# Patient Record
Sex: Female | Born: 1950
Health system: Southern US, Community
[De-identification: ages and names within clinical notes are randomized; demographics above are authoritative.]

## PROBLEM LIST (undated history)

## (undated) DIAGNOSIS — E782 Mixed hyperlipidemia: Secondary | ICD-10-CM

## (undated) DIAGNOSIS — F0390 Unspecified dementia without behavioral disturbance: Secondary | ICD-10-CM

## (undated) DIAGNOSIS — M810 Age-related osteoporosis without current pathological fracture: Secondary | ICD-10-CM

## (undated) DIAGNOSIS — G43909 Migraine, unspecified, not intractable, without status migrainosus: Secondary | ICD-10-CM

## (undated) DIAGNOSIS — R519 Headache, unspecified: Secondary | ICD-10-CM

## (undated) DIAGNOSIS — E2839 Other primary ovarian failure: Secondary | ICD-10-CM

## (undated) DIAGNOSIS — I10 Essential (primary) hypertension: Secondary | ICD-10-CM

## (undated) DIAGNOSIS — I83893 Varicose veins of bilateral lower extremities with other complications: Secondary | ICD-10-CM

## (undated) DIAGNOSIS — R011 Cardiac murmur, unspecified: Secondary | ICD-10-CM

## (undated) DIAGNOSIS — R1310 Dysphagia, unspecified: Secondary | ICD-10-CM

## (undated) DIAGNOSIS — Z860100 Personal history of colon polyps, unspecified: Secondary | ICD-10-CM

## (undated) DIAGNOSIS — R51 Headache: Secondary | ICD-10-CM

## (undated) HISTORY — DX: Essential (primary) hypertension: I10

## (undated) HISTORY — DX: Personal history of colon polyps, unspecified: Z86.0100

## (undated) HISTORY — DX: Migraine, unspecified, not intractable, without status migrainosus: G43.909

## (undated) HISTORY — DX: Age-related osteoporosis without current pathological fracture: M81.0

## (undated) HISTORY — DX: Headache: R51

## (undated) HISTORY — DX: Personal history of colonic polyps: Z86.010

## (undated) HISTORY — DX: Varicose veins of bilateral lower extremities with other complications: I83.893

## (undated) HISTORY — DX: Mixed hyperlipidemia: E78.2

## (undated) HISTORY — PX: WISDOM TOOTH EXTRACTION: SHX21

## (undated) HISTORY — DX: Other primary ovarian failure: E28.39

## (undated) HISTORY — DX: Headache, unspecified: R51.9

## (undated) HISTORY — PX: BREAST BIOPSY: SHX20

---

## 1998-02-14 ENCOUNTER — Other Ambulatory Visit: Admission: RE | Admit: 1998-02-14 | Discharge: 1998-02-14 | Payer: Self-pay | Admitting: Obstetrics and Gynecology

## 1999-03-15 ENCOUNTER — Other Ambulatory Visit: Admission: RE | Admit: 1999-03-15 | Discharge: 1999-03-15 | Payer: Self-pay | Admitting: Obstetrics and Gynecology

## 2000-03-17 ENCOUNTER — Other Ambulatory Visit: Admission: RE | Admit: 2000-03-17 | Discharge: 2000-03-17 | Payer: Self-pay | Admitting: Obstetrics and Gynecology

## 2000-09-22 ENCOUNTER — Encounter: Payer: Self-pay | Admitting: Obstetrics and Gynecology

## 2000-09-22 ENCOUNTER — Encounter: Admission: RE | Admit: 2000-09-22 | Discharge: 2000-09-22 | Payer: Self-pay | Admitting: Obstetrics and Gynecology

## 2000-11-27 ENCOUNTER — Encounter: Admission: RE | Admit: 2000-11-27 | Discharge: 2000-11-27 | Payer: Self-pay | Admitting: Obstetrics and Gynecology

## 2000-11-27 ENCOUNTER — Other Ambulatory Visit: Admission: RE | Admit: 2000-11-27 | Discharge: 2000-11-27 | Payer: Self-pay | Admitting: Obstetrics and Gynecology

## 2000-11-27 ENCOUNTER — Encounter: Payer: Self-pay | Admitting: Obstetrics and Gynecology

## 2001-04-06 ENCOUNTER — Other Ambulatory Visit: Admission: RE | Admit: 2001-04-06 | Discharge: 2001-04-06 | Payer: Self-pay | Admitting: Obstetrics and Gynecology

## 2001-11-29 ENCOUNTER — Encounter: Admission: RE | Admit: 2001-11-29 | Discharge: 2001-11-29 | Payer: Self-pay | Admitting: Obstetrics and Gynecology

## 2001-11-29 ENCOUNTER — Encounter: Payer: Self-pay | Admitting: Obstetrics and Gynecology

## 2001-12-06 ENCOUNTER — Encounter (INDEPENDENT_AMBULATORY_CARE_PROVIDER_SITE_OTHER): Payer: Self-pay | Admitting: *Deleted

## 2001-12-06 ENCOUNTER — Ambulatory Visit (HOSPITAL_BASED_OUTPATIENT_CLINIC_OR_DEPARTMENT_OTHER): Admission: RE | Admit: 2001-12-06 | Discharge: 2001-12-06 | Payer: Self-pay | Admitting: General Surgery

## 2002-04-15 ENCOUNTER — Other Ambulatory Visit: Admission: RE | Admit: 2002-04-15 | Discharge: 2002-04-15 | Payer: Self-pay | Admitting: Obstetrics and Gynecology

## 2003-05-17 ENCOUNTER — Other Ambulatory Visit: Admission: RE | Admit: 2003-05-17 | Discharge: 2003-05-17 | Payer: Self-pay | Admitting: Obstetrics and Gynecology

## 2003-07-27 ENCOUNTER — Encounter: Admission: RE | Admit: 2003-07-27 | Discharge: 2003-07-27 | Payer: Self-pay | Admitting: Family Medicine

## 2003-07-27 ENCOUNTER — Encounter: Payer: Self-pay | Admitting: Family Medicine

## 2003-09-20 ENCOUNTER — Ambulatory Visit (HOSPITAL_COMMUNITY): Admission: RE | Admit: 2003-09-20 | Discharge: 2003-09-20 | Payer: Self-pay | Admitting: *Deleted

## 2003-09-20 ENCOUNTER — Encounter (INDEPENDENT_AMBULATORY_CARE_PROVIDER_SITE_OTHER): Payer: Self-pay | Admitting: Specialist

## 2004-05-29 ENCOUNTER — Other Ambulatory Visit: Admission: RE | Admit: 2004-05-29 | Discharge: 2004-05-29 | Payer: Self-pay | Admitting: Obstetrics and Gynecology

## 2005-06-17 ENCOUNTER — Other Ambulatory Visit: Admission: RE | Admit: 2005-06-17 | Discharge: 2005-06-17 | Payer: Self-pay | Admitting: Obstetrics and Gynecology

## 2006-10-24 ENCOUNTER — Emergency Department (HOSPITAL_COMMUNITY): Admission: EM | Admit: 2006-10-24 | Discharge: 2006-10-25 | Payer: Self-pay | Admitting: Emergency Medicine

## 2008-10-02 ENCOUNTER — Encounter: Admission: RE | Admit: 2008-10-02 | Discharge: 2008-10-02 | Payer: Self-pay | Admitting: Obstetrics and Gynecology

## 2011-03-25 ENCOUNTER — Emergency Department (HOSPITAL_COMMUNITY)
Admission: EM | Admit: 2011-03-25 | Discharge: 2011-03-25 | Disposition: A | Discharge status: Home / Self Care | Payer: BC Managed Care – PPO | Attending: Emergency Medicine | Admitting: Emergency Medicine

## 2011-03-25 ENCOUNTER — Emergency Department (HOSPITAL_COMMUNITY): Payer: BC Managed Care – PPO

## 2011-03-25 DIAGNOSIS — I059 Rheumatic mitral valve disease, unspecified: Secondary | ICD-10-CM | POA: Insufficient documentation

## 2011-03-25 DIAGNOSIS — Z860100 Personal history of colon polyps, unspecified: Secondary | ICD-10-CM | POA: Insufficient documentation

## 2011-03-25 DIAGNOSIS — Z79899 Other long term (current) drug therapy: Secondary | ICD-10-CM | POA: Insufficient documentation

## 2011-03-25 DIAGNOSIS — N201 Calculus of ureter: Secondary | ICD-10-CM | POA: Insufficient documentation

## 2011-03-25 DIAGNOSIS — G43909 Migraine, unspecified, not intractable, without status migrainosus: Secondary | ICD-10-CM | POA: Insufficient documentation

## 2011-03-25 LAB — DIFFERENTIAL
Basophils Absolute: 0 10*3/uL (ref 0.0–0.1)
Basophils Relative: 0 % (ref 0–1)
Eosinophils Absolute: 0 10*3/uL (ref 0.0–0.7)
Neutrophils Relative %: 84 % — ABNORMAL HIGH (ref 43–77)

## 2011-03-25 LAB — URINALYSIS, ROUTINE W REFLEX MICROSCOPIC
Ketones, ur: 15 mg/dL — AB
Nitrite: NEGATIVE
Specific Gravity, Urine: 1.015 (ref 1.005–1.030)
pH: 6.5 (ref 5.0–8.0)

## 2011-03-25 LAB — CBC
Platelets: 203 10*3/uL (ref 150–400)
RBC: 4.52 MIL/uL (ref 3.87–5.11)
WBC: 13.1 10*3/uL — ABNORMAL HIGH (ref 4.0–10.5)

## 2011-03-25 LAB — COMPREHENSIVE METABOLIC PANEL
Albumin: 4.2 g/dL (ref 3.5–5.2)
BUN: 11 mg/dL (ref 6–23)
Chloride: 109 mEq/L (ref 96–112)
Creatinine, Ser: 0.81 mg/dL (ref 0.4–1.2)
Total Bilirubin: 0.3 mg/dL (ref 0.3–1.2)

## 2011-03-25 LAB — URINE MICROSCOPIC-ADD ON

## 2011-03-28 NOTE — Op Note (Signed)
[place]  Patient:    Deanna [name], Deanna [name] Visit Number: [id number] MRN: [id number]          Service Type: DSU Location: [facility] Attending Physician:  Janalyn Rouse Dictated by:   Rose Phi. Maple Hudson, M.D. Proc. Date: 12/06/01 Admit Date:  12/06/2001                             Operative Report  PREOPERATIVE DIAGNOSIS:  Fibroadenoma of the left breast.  POSTOPERATIVE DIAGNOSIS:  Fibroadenoma of the left breast.  OPERATION PERFORMED:  Excision of same.  SURGEON:  Rose Phi. Maple Hudson, M.D.  ANESTHESIA:  MAC.  DESCRIPTION OF PROCEDURE:  The patient was placed on the operating table with the left arm extending on the arm board.  The left breast and axilla were prepped and draped in the usual fashion.  The lesion that was easily palpable was high in the upper outer quadrant and a curved incision overlying it was outlined and the area infiltrated with 1% Xylocaine.  Incision was made and I exposed the fibroadenoma and put a 2-0 suture into it for traction purposes. We then simply excised the fibroadenoma.  Hemostasis obtained with the cautery.  Subcuticular closure with 4-0 Monocryl and Steri-Strips carried out. Dressing applied.  The patient was transferred to the recovery room in satisfactory condition having tolerated the procedure well. Dictated by:   Rose Phi. Maple Hudson, M.D. Attending Physician:  Janalyn Rouse DD:  12/06/01 TD:  12/06/01 Job: [id number]

## 2012-05-18 IMAGING — CT CT ABD-PELV W/O CM
1 of 2 series · 15 of 32 positions shown, 19 images · non-contrast
Comparison: Contrast enhanced abdominal pelvic CT 10/24/2006.

CLINICAL DATA: Right flank pain with nausea and hematuria.
Question ureteral calculus.

CT ABDOMEN AND PELVIS WITHOUT CONTRAST
TECHNIQUE: Multidetector CT imaging of the abdomen and pelvis was
performed following the standard protocol without intravenous
contrast.

[Series 2: under 200# stone no prev · axial · 0.57mm/px · z∈[-417,-87]mm · 15 of 72 slices shown, 19 images]
[im 3/72  soft-tissue]
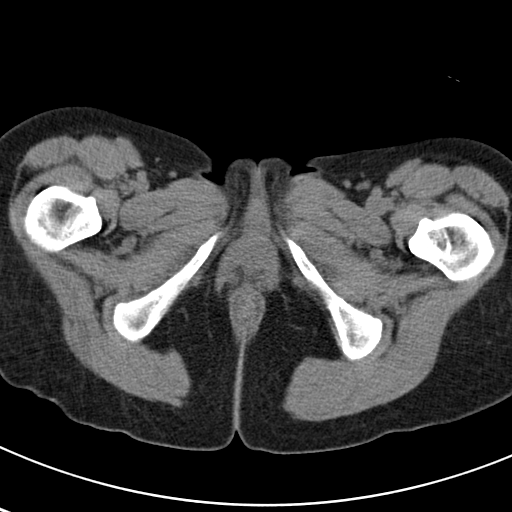
[im 3/72  bone]
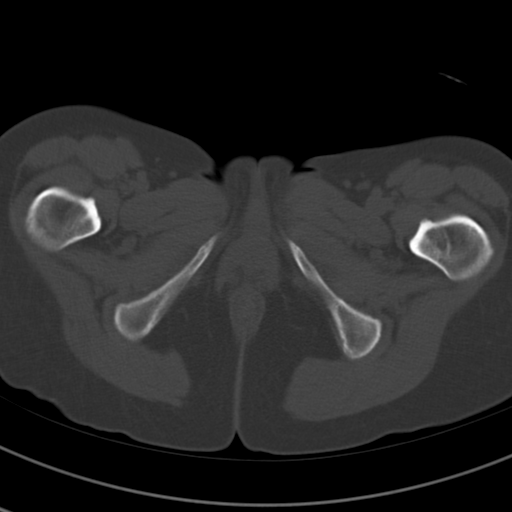
[im 9/72  soft-tissue]
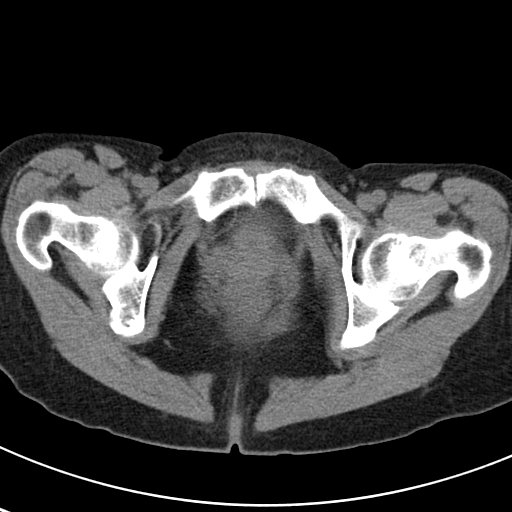
[im 15/72  soft-tissue]
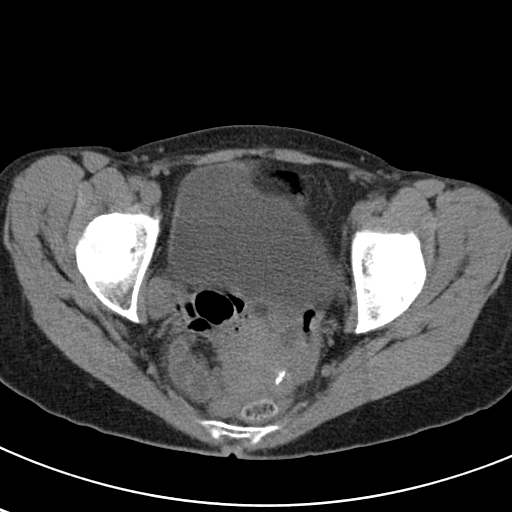
[im 20/72  soft-tissue]
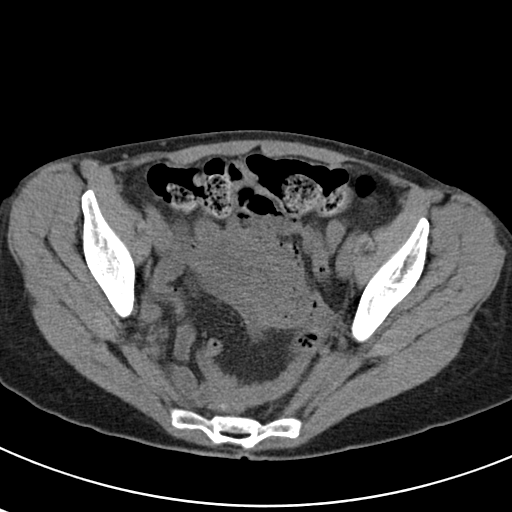
[im 26/72  soft-tissue]
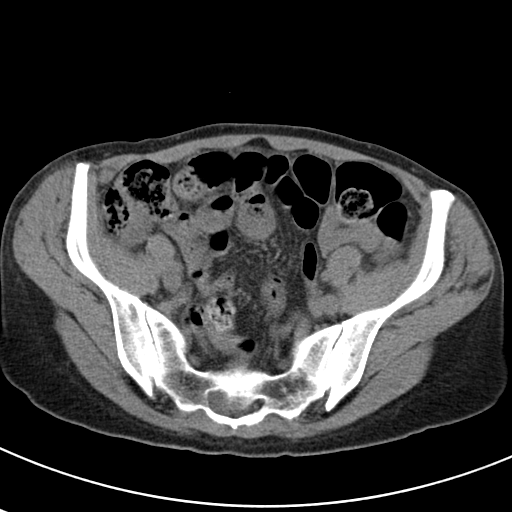
[im 32/72  soft-tissue]
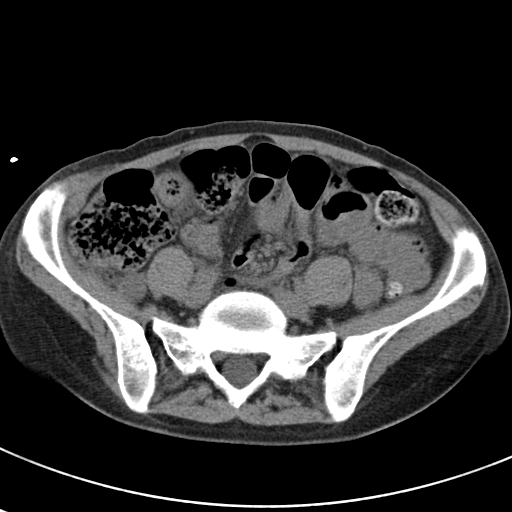
[im 37/72  soft-tissue]
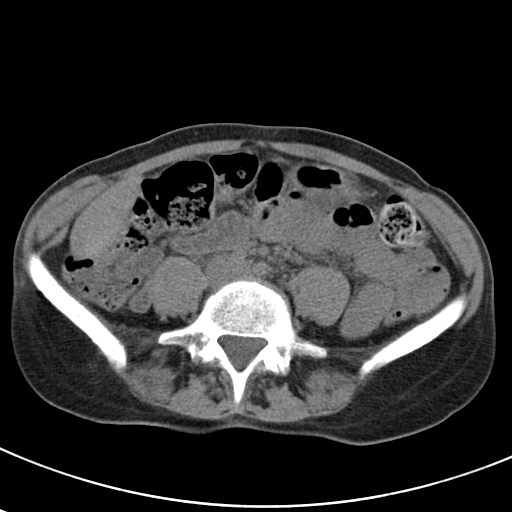
[im 40/72  soft-tissue]
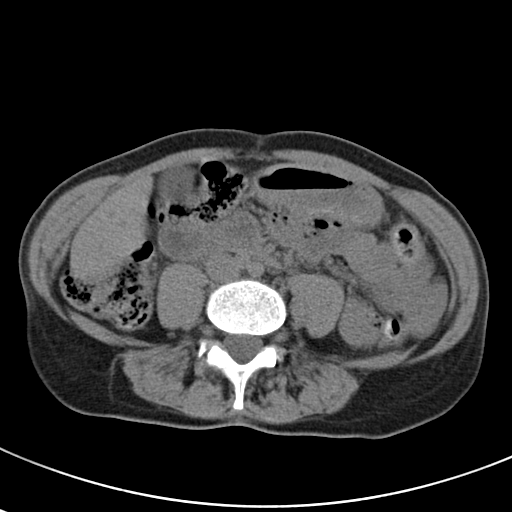
[im 46/72  soft-tissue]
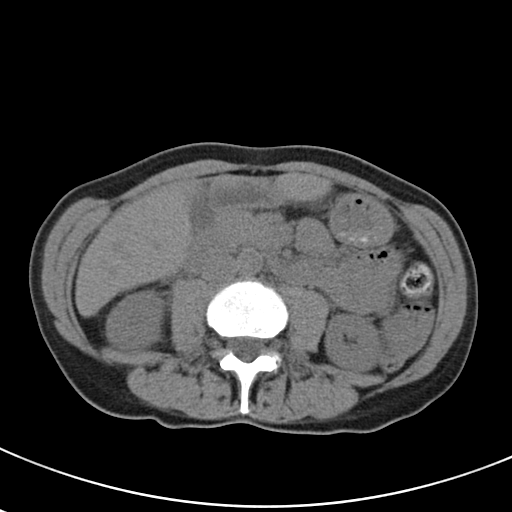
[im 46/72  bone]
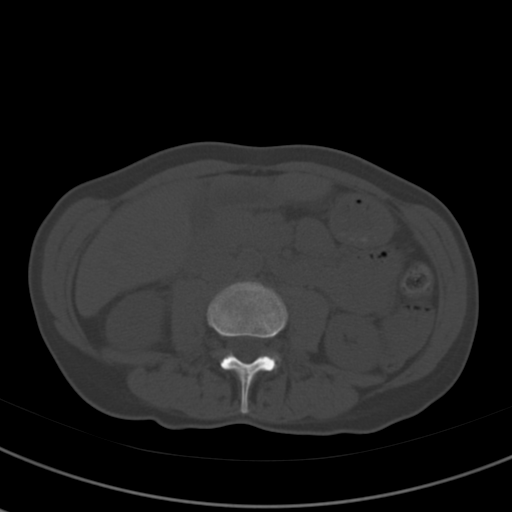
[im 52/72  soft-tissue]
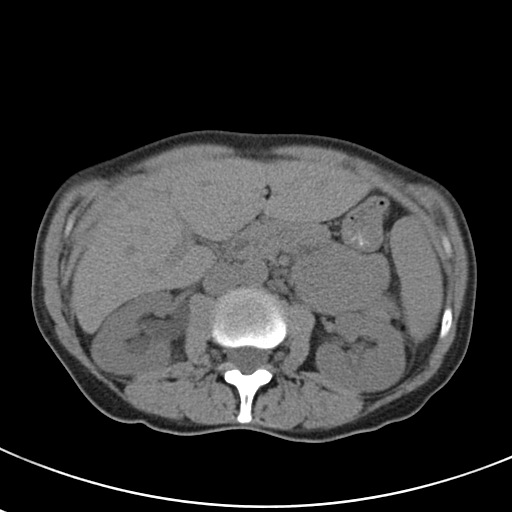
[im 57/72  soft-tissue]
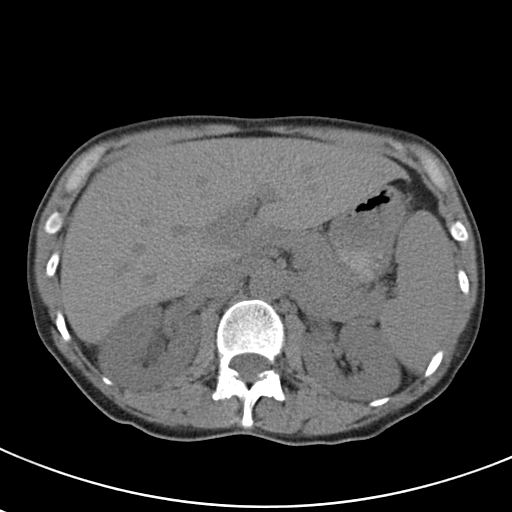
[im 60/72  lung]
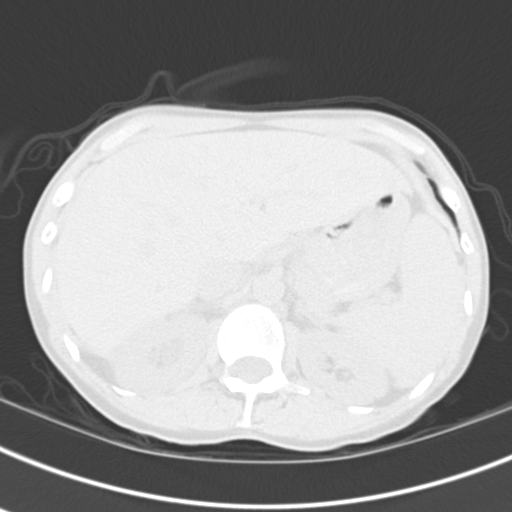
[im 63/72  soft-tissue]
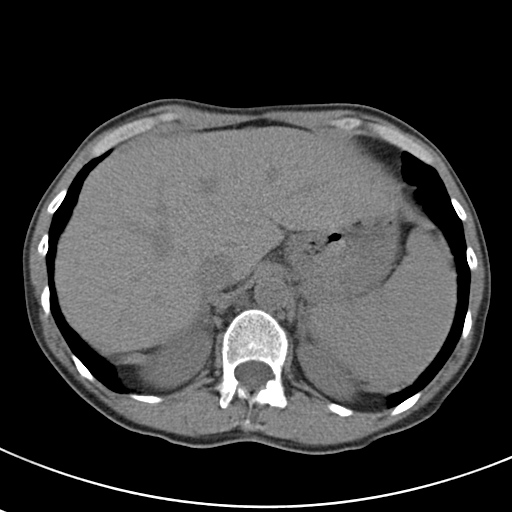
[im 63/72  lung]
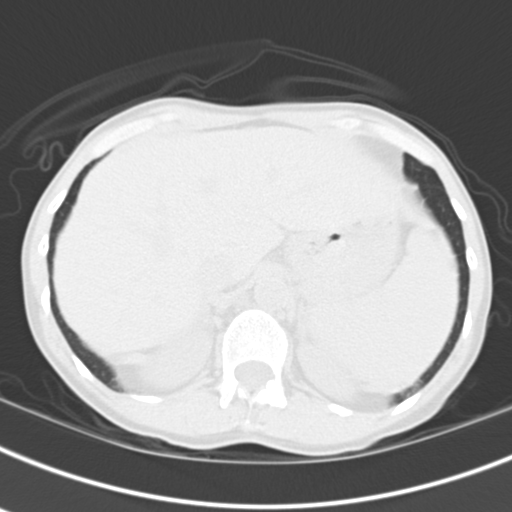
[im 66/72  lung]
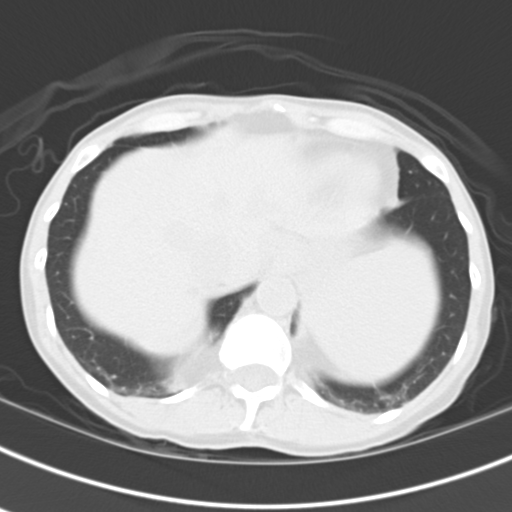
[im 69/72  soft-tissue]
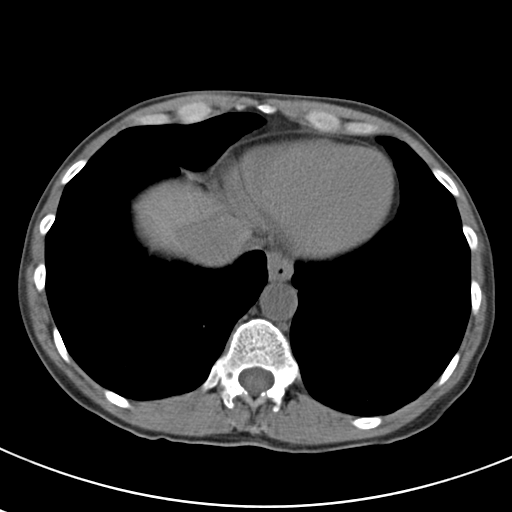
[im 69/72  lung]
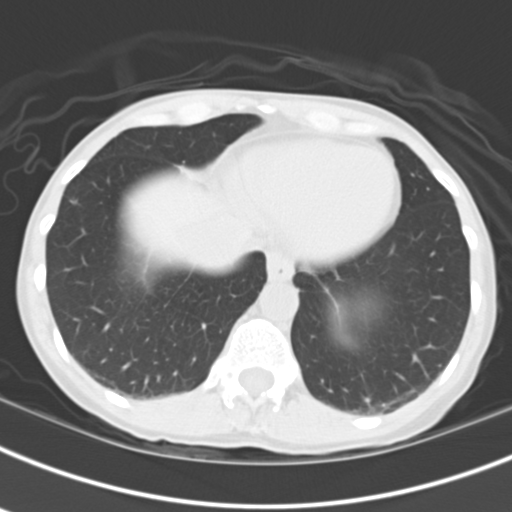

[15 of 32 positions shown; findings below may reference images not displayed]

FINDINGS: There is moderate right-sided hydronephrosis and
hydroureter with asymmetric perinephric soft tissue stranding.  The
right ureter is dilated into the pelvis where there is an
obstructing calculus measuring 5 mm on image 57.  This is located
approximately 2 cm proximal to the ureteral vesicle junction and is
visible on the scout image.  There is a small nonobstructing
calculus posteriorly in the mid-left kidney on image 18.  No other
urinary tract calculi are demonstrated.

The lung bases are clear.  There is no pleural effusion.  As
evaluated in the noncontrast state, the liver, spleen, gallbladder,
pancreas and adrenal glands appear unremarkable.

There are calcified uterine fibroids on the left.  No adnexal mass
or pelvic inflammatory process is demonstrated.  There is no
evidence of bladder calculus.  Bowel gas pattern is normal.  There
are no acute osseous findings.
IMPRESSION: 1.  Obstructing 5 mm calculus in the distal right ureter as
described.  This is visible on the scout image.
2.  Nonobstructing small left renal calculus.
3.  No other acute abdominal findings.

## 2012-12-30 ENCOUNTER — Other Ambulatory Visit: Payer: Self-pay | Admitting: Dermatology

## 2013-06-27 ENCOUNTER — Other Ambulatory Visit: Payer: Self-pay | Admitting: Gastroenterology

## 2013-11-04 ENCOUNTER — Ambulatory Visit: Payer: BC Managed Care – PPO | Admitting: Family Medicine

## 2013-12-30 ENCOUNTER — Other Ambulatory Visit: Payer: Self-pay | Admitting: Dermatology

## 2016-10-17 ENCOUNTER — Other Ambulatory Visit (HOSPITAL_COMMUNITY): Payer: Self-pay | Admitting: Foot & Ankle Surgery

## 2016-10-17 DIAGNOSIS — I739 Peripheral vascular disease, unspecified: Secondary | ICD-10-CM

## 2016-10-23 ENCOUNTER — Ambulatory Visit (HOSPITAL_COMMUNITY)
Admission: RE | Admit: 2016-10-23 | Discharge: 2016-10-23 | Disposition: A | Discharge status: Home / Self Care | Payer: BLUE CROSS/BLUE SHIELD | Source: Ambulatory Visit | Attending: Cardiology | Admitting: Cardiology

## 2016-10-23 DIAGNOSIS — I739 Peripheral vascular disease, unspecified: Secondary | ICD-10-CM | POA: Insufficient documentation

## 2016-10-23 DIAGNOSIS — M79629 Pain in unspecified upper arm: Secondary | ICD-10-CM | POA: Diagnosis not present

## 2017-06-17 DIAGNOSIS — G43719 Chronic migraine without aura, intractable, without status migrainosus: Secondary | ICD-10-CM | POA: Diagnosis not present

## 2017-06-17 DIAGNOSIS — G43019 Migraine without aura, intractable, without status migrainosus: Secondary | ICD-10-CM | POA: Diagnosis not present

## 2017-07-01 DIAGNOSIS — G43019 Migraine without aura, intractable, without status migrainosus: Secondary | ICD-10-CM | POA: Diagnosis not present

## 2017-07-01 DIAGNOSIS — G518 Other disorders of facial nerve: Secondary | ICD-10-CM | POA: Diagnosis not present

## 2017-07-01 DIAGNOSIS — G43719 Chronic migraine without aura, intractable, without status migrainosus: Secondary | ICD-10-CM | POA: Diagnosis not present

## 2017-07-01 DIAGNOSIS — R51 Headache: Secondary | ICD-10-CM | POA: Diagnosis not present

## 2017-07-01 DIAGNOSIS — M791 Myalgia: Secondary | ICD-10-CM | POA: Diagnosis not present

## 2017-07-01 DIAGNOSIS — M542 Cervicalgia: Secondary | ICD-10-CM | POA: Diagnosis not present

## 2017-07-07 DIAGNOSIS — H5203 Hypermetropia, bilateral: Secondary | ICD-10-CM | POA: Diagnosis not present

## 2017-07-15 DIAGNOSIS — G43719 Chronic migraine without aura, intractable, without status migrainosus: Secondary | ICD-10-CM | POA: Diagnosis not present

## 2017-07-15 DIAGNOSIS — G43019 Migraine without aura, intractable, without status migrainosus: Secondary | ICD-10-CM | POA: Diagnosis not present

## 2017-07-15 DIAGNOSIS — M542 Cervicalgia: Secondary | ICD-10-CM | POA: Diagnosis not present

## 2017-07-15 DIAGNOSIS — M791 Myalgia: Secondary | ICD-10-CM | POA: Diagnosis not present

## 2017-07-15 DIAGNOSIS — G518 Other disorders of facial nerve: Secondary | ICD-10-CM | POA: Diagnosis not present

## 2017-07-15 DIAGNOSIS — R51 Headache: Secondary | ICD-10-CM | POA: Diagnosis not present

## 2017-07-30 DIAGNOSIS — M791 Myalgia: Secondary | ICD-10-CM | POA: Diagnosis not present

## 2017-07-30 DIAGNOSIS — M542 Cervicalgia: Secondary | ICD-10-CM | POA: Diagnosis not present

## 2017-07-30 DIAGNOSIS — G518 Other disorders of facial nerve: Secondary | ICD-10-CM | POA: Diagnosis not present

## 2017-07-30 DIAGNOSIS — G43019 Migraine without aura, intractable, without status migrainosus: Secondary | ICD-10-CM | POA: Diagnosis not present

## 2017-07-30 DIAGNOSIS — R51 Headache: Secondary | ICD-10-CM | POA: Diagnosis not present

## 2017-07-30 DIAGNOSIS — G43719 Chronic migraine without aura, intractable, without status migrainosus: Secondary | ICD-10-CM | POA: Diagnosis not present

## 2017-08-13 DIAGNOSIS — R51 Headache: Secondary | ICD-10-CM | POA: Diagnosis not present

## 2017-08-13 DIAGNOSIS — G43019 Migraine without aura, intractable, without status migrainosus: Secondary | ICD-10-CM | POA: Diagnosis not present

## 2017-08-13 DIAGNOSIS — G43719 Chronic migraine without aura, intractable, without status migrainosus: Secondary | ICD-10-CM | POA: Diagnosis not present

## 2017-08-13 DIAGNOSIS — G518 Other disorders of facial nerve: Secondary | ICD-10-CM | POA: Diagnosis not present

## 2017-08-13 DIAGNOSIS — M542 Cervicalgia: Secondary | ICD-10-CM | POA: Diagnosis not present

## 2017-08-13 DIAGNOSIS — M791 Myalgia, unspecified site: Secondary | ICD-10-CM | POA: Diagnosis not present

## 2017-08-21 DIAGNOSIS — D2262 Melanocytic nevi of left upper limb, including shoulder: Secondary | ICD-10-CM | POA: Diagnosis not present

## 2017-08-21 DIAGNOSIS — L821 Other seborrheic keratosis: Secondary | ICD-10-CM | POA: Diagnosis not present

## 2017-08-21 DIAGNOSIS — D0361 Melanoma in situ of right upper limb, including shoulder: Secondary | ICD-10-CM | POA: Diagnosis not present

## 2017-08-21 DIAGNOSIS — D225 Melanocytic nevi of trunk: Secondary | ICD-10-CM | POA: Diagnosis not present

## 2017-08-21 DIAGNOSIS — L814 Other melanin hyperpigmentation: Secondary | ICD-10-CM | POA: Diagnosis not present

## 2017-08-21 DIAGNOSIS — Z85828 Personal history of other malignant neoplasm of skin: Secondary | ICD-10-CM | POA: Diagnosis not present

## 2017-08-21 DIAGNOSIS — Z85820 Personal history of malignant melanoma of skin: Secondary | ICD-10-CM | POA: Diagnosis not present

## 2017-08-21 DIAGNOSIS — D2272 Melanocytic nevi of left lower limb, including hip: Secondary | ICD-10-CM | POA: Diagnosis not present

## 2017-08-21 DIAGNOSIS — D485 Neoplasm of uncertain behavior of skin: Secondary | ICD-10-CM | POA: Diagnosis not present

## 2017-08-21 DIAGNOSIS — D2261 Melanocytic nevi of right upper limb, including shoulder: Secondary | ICD-10-CM | POA: Diagnosis not present

## 2017-08-21 DIAGNOSIS — D0360 Melanoma in situ of unspecified upper limb, including shoulder: Secondary | ICD-10-CM | POA: Diagnosis not present

## 2017-08-21 DIAGNOSIS — D2271 Melanocytic nevi of right lower limb, including hip: Secondary | ICD-10-CM | POA: Diagnosis not present

## 2017-08-31 DIAGNOSIS — D0361 Melanoma in situ of right upper limb, including shoulder: Secondary | ICD-10-CM | POA: Diagnosis not present

## 2017-08-31 DIAGNOSIS — Z85828 Personal history of other malignant neoplasm of skin: Secondary | ICD-10-CM | POA: Diagnosis not present

## 2017-11-13 DIAGNOSIS — G43019 Migraine without aura, intractable, without status migrainosus: Secondary | ICD-10-CM | POA: Diagnosis not present

## 2017-11-13 DIAGNOSIS — G43719 Chronic migraine without aura, intractable, without status migrainosus: Secondary | ICD-10-CM | POA: Diagnosis not present

## 2018-02-11 ENCOUNTER — Encounter: Payer: Self-pay | Admitting: Neurology

## 2018-03-05 ENCOUNTER — Encounter: Payer: Self-pay | Admitting: Neurology

## 2018-03-19 DIAGNOSIS — D225 Melanocytic nevi of trunk: Secondary | ICD-10-CM | POA: Diagnosis not present

## 2018-03-19 DIAGNOSIS — Z85828 Personal history of other malignant neoplasm of skin: Secondary | ICD-10-CM | POA: Diagnosis not present

## 2018-03-19 DIAGNOSIS — L989 Disorder of the skin and subcutaneous tissue, unspecified: Secondary | ICD-10-CM | POA: Diagnosis not present

## 2018-03-19 DIAGNOSIS — L814 Other melanin hyperpigmentation: Secondary | ICD-10-CM | POA: Diagnosis not present

## 2018-03-19 DIAGNOSIS — Z85820 Personal history of malignant melanoma of skin: Secondary | ICD-10-CM | POA: Diagnosis not present

## 2018-03-19 DIAGNOSIS — D2261 Melanocytic nevi of right upper limb, including shoulder: Secondary | ICD-10-CM | POA: Diagnosis not present

## 2018-03-19 DIAGNOSIS — D2262 Melanocytic nevi of left upper limb, including shoulder: Secondary | ICD-10-CM | POA: Diagnosis not present

## 2018-03-19 DIAGNOSIS — D485 Neoplasm of uncertain behavior of skin: Secondary | ICD-10-CM | POA: Diagnosis not present

## 2018-03-19 DIAGNOSIS — D2271 Melanocytic nevi of right lower limb, including hip: Secondary | ICD-10-CM | POA: Diagnosis not present

## 2018-03-19 DIAGNOSIS — D2272 Melanocytic nevi of left lower limb, including hip: Secondary | ICD-10-CM | POA: Diagnosis not present

## 2018-04-26 DIAGNOSIS — Z85828 Personal history of other malignant neoplasm of skin: Secondary | ICD-10-CM | POA: Diagnosis not present

## 2018-04-26 DIAGNOSIS — D485 Neoplasm of uncertain behavior of skin: Secondary | ICD-10-CM | POA: Diagnosis not present

## 2018-04-26 DIAGNOSIS — L988 Other specified disorders of the skin and subcutaneous tissue: Secondary | ICD-10-CM | POA: Diagnosis not present

## 2018-05-28 ENCOUNTER — Ambulatory Visit: Payer: PPO | Admitting: Neurology

## 2018-05-28 ENCOUNTER — Other Ambulatory Visit: Payer: Self-pay

## 2018-05-28 ENCOUNTER — Encounter: Payer: Self-pay | Admitting: Neurology

## 2018-05-28 VITALS — BP 140/82 | HR 74 | Ht 63.0 in | Wt 84.8 lb

## 2018-05-28 DIAGNOSIS — F17200 Nicotine dependence, unspecified, uncomplicated: Secondary | ICD-10-CM

## 2018-05-28 DIAGNOSIS — G43009 Migraine without aura, not intractable, without status migrainosus: Secondary | ICD-10-CM | POA: Diagnosis not present

## 2018-05-28 NOTE — Patient Instructions (Signed)
Migraine Recommendations: 1.  Continue topiramate 250mg  at bedtime and nortriptyline 50mg  at bedtime 2.  Take Excedrin at earliest onset of migraine.  You may take Zomig if needed, but try to avoid.  Eventually I would like for you to stop the Zomig 3.  Limit use of pain relievers to no more than 2 days out of the week.  These medications include acetaminophen, ibuprofen, triptans and narcotics.  This will help reduce risk of rebound headaches. 4.  Be aware of common food triggers such as processed sweets, processed foods with nitrites (such as deli meat, hot dogs, sausages), foods with MSG, alcohol (such as wine), chocolate, certain cheeses, certain fruits (dried fruits, bananas, some citrus fruit), vinegar, diet soda. 4.  Avoid caffeine 5.  Routine exercise 6.  Proper sleep hygiene 7.  Stay adequately hydrated with water 8.  Keep a headache diary. 9.  Maintain proper stress management. 10.  Do not skip meals. 11.  Consider supplements:  Magnesium citrate 400mg  to 600mg  daily, riboflavin 400mg , Coenzyme Q 10 100mg  three times daily 12.  Follow up in 6 months

## 2018-05-28 NOTE — Progress Notes (Addendum)
NEUROLOGY CONSULTATION NOTE  THURSA EMME MRN: [id number] DOB: 1951/01/10  Referring provider: Orie Rout, MD Primary care provider: Hulan Fess, MD  Reason for consult:  migraines  HISTORY OF PRESENT ILLNESS: Deanna [name] is a 67 year old right-handed female with mitral valve prolapse and light tobacco smoker who presents for migraines.  Her previous neurologist was Dr. Orie Rout.  However, he is no longer contracted with her new insurance.  History supplemented by prior neurologist's notes.  Onset:  67 years old Location:  right temporal/periorbital Quality:  Non-throbbing Intensity:  Mild to moderate.  She denies new headache, thunderclap headache or severe headache that wakes her from sleep. Aura:  no Prodrome:  no Postdrome:  no Associated symptoms:  photophobia, phonophobia, dizziness, infrequently nausea.  She denies associated vomiting, visual disturbance, autonomic symptoms, osmophobia or unilateral numbness or weakness. Duration:  1 to 2 hours if takes medication at earliest onset Frequency:  8 days a month (4 mild, 3 moderate, 1 severe) Frequency of abortive medication: as needed Triggers:  allergies, skipping meals, sunlight/glare, change in weather, stress, certain scents Aggravating factors:  no Relieving factors:  Excedrin or Zomig Activity:  Severe aggravates  Current NSAIDS:  no Current analgesics:  Excedrin (for mild to severe) Current triptans:  Zomig 5mg  NS (for severe) Current ergotamines:  no Current anti-emetic:  no Current muscle relaxants:  no Current anti-anxiolytic:  no Current sleep aide:  no Current Antihypertensive medications:  no Current Antidepressant medications:  nortriptyline 50mg  Current Anticonvulsant medications:  topiramate 250mg  Current anti-CGRP:  no Current Vitamins/Herbal/Supplements:  MVI Current Antihistamines/Decongestants:  no Other therapy:  no  Past NSAIDS:  Aleve, ibuprofen, Cambia Past  analgesics:  acetaminophen, BC powder, Goody powder, Excedrin Past abortive triptans:  sumatriptan tablet, Maxalt, Relpax Past ergotamines:  No Past muscle relaxants:  Flexeril, Robaxin, Baclofen, Skelaxin Past anti-emetic:  no Past antihypertensive medications:  no Past antidepressant medications:  imipramine Past anticonvulsant medications:  No Past anti-CGRP: no Past vitamins/Herbal/Supplements:  no Past antihistamines/decongestants:  Allegra, Benadryl, Clarinex, Claritin, Dramamine, Flonase, pseudoephedrine Other past therapies:  trigger point injections  Caffeine:  decaff Alcohol:  no Smoker:  5 to 6 cigarettes a day Diet:  Does not skip meals.  Decaf coffee and decaff Pepsi.  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 Headache     PAST SURGICAL HISTORY: Breast biopsy Tonsillectomy  MEDICATIONS: Current Outpatient Medications on File Prior to Visit  Medication Sig Dispense Refill  . Multiple Vitamin (MULTIVITAMIN) tablet Take 1 tablet by mouth daily.    Marland Kitchen topiramate (TOPAMAX) 50 MG tablet Take 50 mg by mouth at bedtime.     No current facility-administered medications on file prior to visit.     ALLERGIES: Allergies  Allergen Reactions  . Sulfa Antibiotics     FAMILY HISTORY: Family History  Problem Relation Age of Onset  . Dementia Mother 43       in a memory care unit  . Heart attack Father 8  . Benign prostatic hyperplasia Brother   . CVA Maternal Aunt     SOCIAL HISTORY: Social History   Socioeconomic History  . Marital status: Single    Spouse name: Merry Proud  . Number of children: Not on file  . Years  of education: Not on file  . Highest education level: 12th grade  Occupational History  . Occupation:  retired  Scientific laboratory technician  . Financial resource strain: Not on file  . Food insecurity:    Worry: Not on file    Inability: Not on file  . Transportation needs:    Medical: Not on file    Non-medical: Not on file  Tobacco Use  . Smoking status: Never Smoker  . Smokeless tobacco: Never Used  Substance and Sexual Activity  . Alcohol use: Never    Frequency: Never  . Drug use: Never  . Sexual activity: Not on file  Lifestyle  . Physical activity:    Days per week: Not on file    Minutes per session: Not on file  . Stress: Not on file  Relationships  . Social connections:    Talks on phone: Not on file    Gets together: Not on file    Attends religious service: Not on file    Active member of club or organization: Not on file    Attends meetings of clubs or organizations: Not on file    Relationship status: Not on file  . Intimate partner violence:    Fear of current or ex partner: Not on file    Emotionally abused: Not on file    Physically abused: Not on file    Forced sexual activity: Not on file  Other Topics Concern  . Not on file  Social History Narrative   Patient is right-handed. She lives with her husband in a one story house with stairs to the basement. She avoids caffeine. She works on their farm.     REVIEW OF SYSTEMS: Constitutional: No fevers, chills, or sweats, no generalized fatigue, change in appetite Eyes: No visual changes, double vision, eye pain Ear, nose and throat: No hearing loss, ear pain, nasal congestion, sore throat Cardiovascular: No chest pain, palpitations Respiratory:  No shortness of breath at rest or with exertion, wheezes GastrointestinaI: No nausea, vomiting, diarrhea, abdominal pain, fecal incontinence Genitourinary:  No dysuria, urinary retention or frequency Musculoskeletal:  No neck pain, back pain Integumentary: No rash, pruritus, skin lesions Neurological: as above Psychiatric: No depression, insomnia, anxiety Endocrine: No  palpitations, fatigue, diaphoresis, mood swings, change in appetite, change in weight, increased thirst Hematologic/Lymphatic:  No purpura, petechiae. Allergic/Immunologic: no itchy/runny eyes, nasal congestion, recent allergic reactions, rashes  PHYSICAL EXAM: Vitals:   05/28/18 0816  BP: 140/82  Pulse: 74  SpO2: 98%   General: No acute distress.  Patient appears well-groomed.  Head:  Normocephalic/atraumatic Eyes:  fundi examined but not visualized Neck: supple, no paraspinal tenderness, full range of motion Back: No paraspinal tenderness Heart: regular rate and rhythm Lungs: Clear to auscultation bilaterally. Vascular: No carotid bruits. Neurological Exam: Mental status: alert and oriented to person, place, and time, recent and remote memory intact, fund of knowledge intact, attention and concentration intact, speech fluent and not dysarthric, language intact. Cranial nerves: CN I: not tested CN II: pupils equal, round and reactive to light, visual fields intact CN III, IV, VI:  full range of motion, no nystagmus, no ptosis CN V: facial sensation intact CN VII: upper and lower face symmetric CN VIII: hearing intact CN IX, X: gag intact, uvula midline CN XI: sternocleidomastoid and trapezius muscles intact CN XII: tongue midline Bulk & Tone: normal, no fasciculations. Motor:  5/5 throughout  Sensation:  Pinprick and vibration sensation intact. Deep Tendon Reflexes:  2+ throughout, toes downgoing.  Finger to nose testing:  Without dysmetria.  Heel to shin:  Without dysmetria.  Gait:  Normal station and stride.  Able to turn and tandem walk. Romberg negative.  IMPRESSION: Episodic migraine without aura, without status migrainosus, not intractable Tobacco use disorder  PLAN: 1.  Continue topiramate 250mg  at bedtime and nortriptyline 50mg  at bedtime 2.  Advised to try and take Excedrin only (since it does abort the severe migraines if taken early).  She may take Zomig if  really needed.  However, we discussed discontinuing triptans due to her age with tobacco use. 3.  Limit use of pain relievers to no more than 2 days out of the week.  These medications include acetaminophen, ibuprofen, triptans and narcotics.  This will help reduce risk of rebound headaches. 4.  Be aware of common food triggers such as processed sweets, processed foods with nitrites (such as deli meat, hot dogs, sausages), foods with MSG, alcohol (such as wine), chocolate, certain cheeses, certain fruits (dried fruits, bananas, some citrus fruit), vinegar, diet soda. 4.  Avoid caffeine 5.  Routine exercise 6.  Proper sleep hygiene 7.  Stay adequately hydrated with water 8.  Keep a headache diary. 9.  Maintain proper stress management. 10.  Do not skip meals. 11.  Consider supplements:  Magnesium citrate 400mg  to 600mg  daily, riboflavin 400mg , Coenzyme Q 10 100mg  three times daily 12.  Tobacco cessation counseling (CPT 99406):  Tobacco with no history of CAD, stroke, or cancer  - Currently smoking 5-6 cigarettes/day   - Patient was informed of the dangers of tobacco abuse including stroke, cancer, and MI, as well as benefits of tobacco cessation. - Patient is not willing to quit at this time. - Approximately 5 mins were spent counseling patient cessation techniques. We discussed various methods to help quit smoking, including deciding on a date to quit, joining a support group, pharmacological agents- nicotine gum/patch/lozenges, chantix.  - I will reassess her progress at the next follow-up visit  13.  Follow up in 6 months  Thank you for allowing me to take part in the care of this patient.  45 minutes spent face to face with patient, over 50% spent discussing management.  Metta Clines, DO  CC:  Hulan Fess, MD  Orie Rout, MD

## 2018-06-01 ENCOUNTER — Other Ambulatory Visit: Payer: Self-pay | Admitting: Neurology

## 2018-08-17 ENCOUNTER — Telehealth: Payer: Self-pay | Admitting: Neurology

## 2018-08-17 MED ORDER — NORTRIPTYLINE HCL 25 MG PO CAPS: 50.0000 mg | ORAL_CAPSULE | Freq: Every day | ORAL | 1 refills | Status: DC

## 2018-08-17 NOTE — Telephone Encounter (Signed)
Patient called regarding needing her Nortrityline filled at [facility] in [place]. She is needing a 60 Day supply. Thanks

## 2018-08-17 NOTE — Telephone Encounter (Signed)
90 day supply sent to pharmacy

## 2018-09-23 DIAGNOSIS — L989 Disorder of the skin and subcutaneous tissue, unspecified: Secondary | ICD-10-CM | POA: Diagnosis not present

## 2018-09-23 DIAGNOSIS — D2261 Melanocytic nevi of right upper limb, including shoulder: Secondary | ICD-10-CM | POA: Diagnosis not present

## 2018-09-23 DIAGNOSIS — D2272 Melanocytic nevi of left lower limb, including hip: Secondary | ICD-10-CM | POA: Diagnosis not present

## 2018-09-23 DIAGNOSIS — D2271 Melanocytic nevi of right lower limb, including hip: Secondary | ICD-10-CM | POA: Diagnosis not present

## 2018-09-23 DIAGNOSIS — L814 Other melanin hyperpigmentation: Secondary | ICD-10-CM | POA: Diagnosis not present

## 2018-09-23 DIAGNOSIS — D2262 Melanocytic nevi of left upper limb, including shoulder: Secondary | ICD-10-CM | POA: Diagnosis not present

## 2018-09-23 DIAGNOSIS — Z85828 Personal history of other malignant neoplasm of skin: Secondary | ICD-10-CM | POA: Diagnosis not present

## 2018-09-23 DIAGNOSIS — D485 Neoplasm of uncertain behavior of skin: Secondary | ICD-10-CM | POA: Diagnosis not present

## 2018-09-23 DIAGNOSIS — D225 Melanocytic nevi of trunk: Secondary | ICD-10-CM | POA: Diagnosis not present

## 2018-09-23 DIAGNOSIS — C44612 Basal cell carcinoma of skin of right upper limb, including shoulder: Secondary | ICD-10-CM | POA: Diagnosis not present

## 2018-09-23 DIAGNOSIS — Z85820 Personal history of malignant melanoma of skin: Secondary | ICD-10-CM | POA: Diagnosis not present

## 2018-10-01 DIAGNOSIS — C44612 Basal cell carcinoma of skin of right upper limb, including shoulder: Secondary | ICD-10-CM | POA: Diagnosis not present

## 2018-10-01 DIAGNOSIS — Z85828 Personal history of other malignant neoplasm of skin: Secondary | ICD-10-CM | POA: Diagnosis not present

## 2018-11-18 DIAGNOSIS — L7682 Other postprocedural complications of skin and subcutaneous tissue: Secondary | ICD-10-CM | POA: Diagnosis not present

## 2018-11-18 DIAGNOSIS — Z85828 Personal history of other malignant neoplasm of skin: Secondary | ICD-10-CM | POA: Diagnosis not present

## 2018-11-18 DIAGNOSIS — D485 Neoplasm of uncertain behavior of skin: Secondary | ICD-10-CM | POA: Diagnosis not present

## 2018-11-25 ENCOUNTER — Other Ambulatory Visit: Payer: Self-pay | Admitting: Neurology

## 2018-12-01 NOTE — Progress Notes (Signed)
NEUROLOGY FOLLOW UP OFFICE NOTE  Deanna [name] [id number]  HISTORY OF PRESENT ILLNESS: Deanna [name] is a 68 year old right-handed female with mitral valve prolapse who follows up for migraines.  UPDATE: She has had numerous low-grade headaches about 3 days and 1 migraine over past 2 weeks.  First migraine in last 3 months.  Treats low grade headache with Excedrin and migraine with Zomig 5mg  NS.  Her insurance is no longer covering her Zomig NS.  They recommended Zomig tablet. Rescue protocol:  Excedrin for low-grade headache.  Zomig 5mg  NS for severe migraine. Current NSAIDS: No Current analgesics: Excedrin Current triptans: Zomig 5 mg NS Current ergotamine: None Current anti-emetic: None Current muscle relaxants: None Current anti-anxiolytic: None Current sleep aide: None Current Antihypertensive medications: None Current Antidepressant medications: Nortriptyline 50 mg Current Anticonvulsant medications: Topiramate 250 mg Current anti-CGRP: None Current Vitamins/Herbal/Supplements: Multivitamin Current Antihistamines/Decongestants: None Other therapy: None Hormone/birth control: none  Caffeine: Decaf coffee only Alcohol: No Smoker: 5 to 6 cigarettes a day Diet: Does not skip meals.  Decaf coffee and decaf Pepsi.  Needs to increase water intake. 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 Depression: No; Anxiety: Some anxiety Other pain: No Sleep hygiene: Good  HISTORY:  Onset: 67 years old Location:  right temporal/periorbital Quality:  Non-throbbing Initial intensity:  Mild to moderate.  She denies new headache, thunderclap headache or severe headache that wakes her from sleep. Aura:  no Prodrome:  no Postdrome:  no Associated symptoms: Photophobia, phonophobia, dizziness, infrequently nausea.  She denies associated vomiting, visual disturbance, autonomic symptoms, osmophobia or unilateral numbness or weakness. Initial duration:  1 to 2 hours if takes  medication at earliest onset Initial Frequency:  8 days a month (4 mild, 3 moderate, 1 severe) Initial Frequency of abortive medication: as needed Triggers: Allergies, skipping meals, sunlight/glare, change in weather, stress, certain scents Aggravating factors:  no Relieving factors:  Excedrin or Zomig Activity:  Severe aggravates  Past NSAIDS:  Aleve, ibuprofen Past analgesics:  acetaminophen, BC powder, Goody powder, Excedrin Past abortive triptans:  sumatriptan tablet, Maxalt, Relpax Past ergotamines:  No Past muscle relaxants:  Flexeril, Robaxin, Baclofen, Skelaxin Past anti-emetic:  no Past antihypertensive medications:  no Past antidepressant medications:  imipramine Past anticonvulsant medications:  No Past anti-CGRP: no Past vitamins/Herbal/Supplements:  no Past antihistamines/decongestants:  Allegra, Benadryl, Clarinex, Claritin, Dramamine, Flonase, pseudoephedrine Other past therapies:  trigger point injections  Family history of headache:  Paternal grandmother  PAST MEDICAL HISTORY: Past Medical History:  Diagnosis Date  . Headache     MEDICATIONS: Current Outpatient Medications on File Prior to Visit  Medication Sig Dispense Refill  . Multiple Vitamin (MULTIVITAMIN) tablet Take 1 tablet by mouth daily.    . nortriptyline (PAMELOR) 25 MG capsule Take 2 capsules (50 mg total) by mouth at bedtime. 180 capsule 1  . topiramate (TOPAMAX) 200 MG tablet TAKE 1 TABLET BY MOUTH EVERY DAY 90 tablet 1  . topiramate (TOPAMAX) 50 MG tablet TAKE 1 TABLET BY MOUTH EVERY DAY 90 tablet 1   No current facility-administered medications on file prior to visit.     ALLERGIES: Allergies  Allergen Reactions  . Sulfa Antibiotics     FAMILY HISTORY: Family History  Problem Relation Age of Onset  . Dementia Mother 68       in a memory care unit  . Heart attack Father 37  . Benign prostatic hyperplasia Brother   . CVA Maternal Aunt     SOCIAL HISTORY: Social History  Socioeconomic History  . Marital status: Single    Spouse name: Merry Proud  . Number of children: Not on file  . Years of education: Not on file  . Highest education level: 12th grade  Occupational History  . Occupation: retired  Scientific laboratory technician  . Financial resource strain: Not on file  . Food insecurity:    Worry: Not on file    Inability: Not on file  . Transportation needs:    Medical: Not on file    Non-medical: Not on file  Tobacco Use  . Smoking status: Light Tobacco Smoker    Packs/day: 0.25    Types: Cigarettes  . Smokeless tobacco: Never Used  Substance and Sexual Activity  . Alcohol use: Never    Frequency: Never  . Drug use: Never  . Sexual activity: Not on file  Lifestyle  . Physical activity:    Days per week: Not on file    Minutes per session: Not on file  . Stress: Not on file  Relationships  . Social connections:    Talks on phone: Not on file    Gets together: Not on file    Attends religious service: Not on file    Active member of club or organization: Not on file    Attends meetings of clubs or organizations: Not on file    Relationship status: Not on file  . Intimate partner violence:    Fear of current or ex partner: Not on file    Emotionally abused: Not on file    Physically abused: Not on file    Forced sexual activity: Not on file  Other Topics Concern  . Not on file  Social History Narrative   Patient is right-handed. She lives with her husband in a one story house with stairs to the basement. She avoids caffeine. She works on their farm.     REVIEW OF SYSTEMS: Constitutional: No fevers, chills, or sweats, no generalized fatigue, change in appetite Eyes: No visual changes, double vision, eye pain Ear, nose and throat: No hearing loss, ear pain, nasal congestion, sore throat Cardiovascular: No chest pain, palpitations Respiratory:  No shortness of breath at rest or with exertion, wheezes GastrointestinaI: No nausea, vomiting, diarrhea,  abdominal pain, fecal incontinence Genitourinary:  No dysuria, urinary retention or frequency Musculoskeletal:  No neck pain, back pain Integumentary: No rash, pruritus, skin lesions Neurological: as above Psychiatric: No depression, insomnia, anxiety Endocrine: No palpitations, fatigue, diaphoresis, mood swings, change in appetite, change in weight, increased thirst Hematologic/Lymphatic:  No purpura, petechiae. Allergic/Immunologic: no itchy/runny eyes, nasal congestion, recent allergic reactions, rashes  PHYSICAL EXAM: Blood pressure (!) 156/100, pulse (!) 105, height 5\' 3"  (1.6 m), weight 86 lb (39 kg), SpO2 99 %. General: No acute distress.  Patient appears well-groomed.   Head:  Normocephalic/atraumatic Eyes:  Fundi examined but not visualized Neck: supple, no paraspinal tenderness, full range of motion Heart:  Regular rate and rhythm Lungs:  Clear to auscultation bilaterally Back: No paraspinal tenderness Neurological Exam: alert and oriented to person, place, and time. Attention span and concentration intact, recent and remote memory intact, fund of knowledge intact.  Speech fluent and not dysarthric, language intact.  CN II-XII intact. Bulk and tone normal, muscle strength 5/5 throughout.  Sensation to light touch intact.  Deep tendon reflexes 2+ throughout, toes downgoing.  Finger to nose and heel to shin testing intact.  Gait normal, Romberg negative.  IMPRESSION: 1.  Migraine without aura, without status migrainosus, not intractable 2.  Elevated blood pressure and heart rate.  She was a little agitated trying to find a parking spot in the parking lot and was rushing upstairs to make her appointment.  Her vitals are typically normal.  PLAN: 1.  She will continue nortriptyline 50 mg at bedtime and topiramate 250 mg at bedtime. 2.  Instead of the nasal spray, I will prescribe Zomig 5 mg tablet.  Good Rx card was provided to help with cost. 3.  For neck soreness/tightness, I will  prescribe her tizanidine 2 mg.  She was advised not to drive for 6 hours after taking it due to possible drowsiness. 4.  Limit use of pain relievers to no more than 2 days out of week to prevent risk of rebound or medication-overuse headache. 5.  Keep headache diary 6.  Follow up in 6 months.  Metta Clines, DO  CC: Hulan Fess, MD

## 2018-12-02 ENCOUNTER — Ambulatory Visit: Payer: PPO | Admitting: Neurology

## 2018-12-02 ENCOUNTER — Encounter: Payer: Self-pay | Admitting: Neurology

## 2018-12-02 VITALS — BP 156/100 | HR 105 | Ht 63.0 in | Wt 86.0 lb

## 2018-12-02 DIAGNOSIS — G43009 Migraine without aura, not intractable, without status migrainosus: Secondary | ICD-10-CM

## 2018-12-02 MED ORDER — TIZANIDINE HCL 2 MG PO TABS: 2.0000 mg | ORAL_TABLET | Freq: Four times a day (QID) | ORAL | 3 refills | Status: DC | PRN

## 2018-12-02 MED ORDER — ZOLMITRIPTAN 5 MG PO TABS: ORAL_TABLET | ORAL | 3 refills | Status: DC

## 2018-12-02 NOTE — Patient Instructions (Addendum)
1.  I prescribed the zomig tablet.  Take as directed. 2.  Limit use of pain relievers to no more than 2 days out of week to prevent risk of rebound or medication-overuse headache. 3.  When you get the soreness/pain in back of neck, try the tizanidine 2mg .  May take every 6 hours as needed, but caution for drowsiness.  Therefore, I would not drive for about 6 hours after taken. 4.  Continue nortriptyline 50mg  at bedtime and topiramate 250mg  at bedtime.

## 2018-12-20 DIAGNOSIS — H524 Presbyopia: Secondary | ICD-10-CM | POA: Diagnosis not present

## 2018-12-20 DIAGNOSIS — H52223 Regular astigmatism, bilateral: Secondary | ICD-10-CM | POA: Diagnosis not present

## 2018-12-20 DIAGNOSIS — H5203 Hypermetropia, bilateral: Secondary | ICD-10-CM | POA: Diagnosis not present

## 2018-12-20 DIAGNOSIS — H25093 Other age-related incipient cataract, bilateral: Secondary | ICD-10-CM | POA: Diagnosis not present

## 2019-02-08 ENCOUNTER — Encounter (HOSPITAL_COMMUNITY): Payer: Self-pay | Admitting: Emergency Medicine

## 2019-02-08 ENCOUNTER — Other Ambulatory Visit: Payer: Self-pay

## 2019-02-08 ENCOUNTER — Ambulatory Visit (HOSPITAL_COMMUNITY)
Admission: EM | Admit: 2019-02-08 | Discharge: 2019-02-08 | Disposition: A | Discharge status: Home / Self Care | Payer: PPO | Attending: Family Medicine | Admitting: Family Medicine

## 2019-02-08 DIAGNOSIS — N12 Tubulo-interstitial nephritis, not specified as acute or chronic: Secondary | ICD-10-CM | POA: Diagnosis not present

## 2019-02-08 DIAGNOSIS — R059 Cough, unspecified: Secondary | ICD-10-CM

## 2019-02-08 DIAGNOSIS — R05 Cough: Secondary | ICD-10-CM | POA: Diagnosis not present

## 2019-02-08 LAB — POCT URINALYSIS DIP (DEVICE)
Bilirubin Urine: NEGATIVE
Glucose, UA: NEGATIVE mg/dL
Ketones, ur: NEGATIVE mg/dL
Nitrite: POSITIVE — AB
Protein, ur: 100 mg/dL — AB
Specific Gravity, Urine: 1.015 (ref 1.005–1.030)
Urobilinogen, UA: 0.2 mg/dL (ref 0.0–1.0)
pH: 5.5 (ref 5.0–8.0)

## 2019-02-08 MED ORDER — BENZONATATE 200 MG PO CAPS: 200.0000 mg | ORAL_CAPSULE | Freq: Three times a day (TID) | ORAL | 0 refills | Status: AC | PRN

## 2019-02-08 MED ORDER — CEFTRIAXONE SODIUM 1 G IJ SOLR
1.0000 g | Freq: Once | INTRAMUSCULAR | Status: AC
  Administered 2019-02-08: 1 g via INTRAMUSCULAR

## 2019-02-08 MED ORDER — TAMSULOSIN HCL 0.4 MG PO CAPS: 0.4000 mg | ORAL_CAPSULE | Freq: Every day | ORAL | 0 refills | Status: DC

## 2019-02-08 MED ORDER — CEFTRIAXONE SODIUM 1 G IJ SOLR
INTRAMUSCULAR | Status: AC
  Filled 2019-02-08: qty 10

## 2019-02-08 MED ORDER — CIPROFLOXACIN HCL 500 MG PO TABS: 500.0000 mg | ORAL_TABLET | Freq: Two times a day (BID) | ORAL | 0 refills | Status: AC

## 2019-02-08 NOTE — ED Provider Notes (Signed)
[place]    CSN: [id number] Arrival date & time: 02/08/19  1800     History   Chief Complaint Chief Complaint  Patient presents with   Cough   Urinary Frequency    HPI Deanna [name] is a 68 y.o. female history of headaches presenting today for evaluation of cough and back pain.  Patient states that 2 days ago she started to develop back pain on her left lower back.  Noticed it mainly with taking a deep breath as well as with coughing.  She is also become feverish beginning yesterday.  She took an aspirin for this.  She feels as if her cough is improving.  The pain in her back feels similar to when she previously had a kidney stone.  She is also noticed urinary frequency and urgency, incomplete voiding, but denies dysuria.  She denies congestion, sore throat.  Cough is nonproductive.  Denies shortness of breath. Denies any recent travel, denies any known exposure to COVID-19.  Denies any sick contacts.   HPI  Past Medical History:  Diagnosis Date   Headache     There are no active problems to display for this patient.   History reviewed. No pertinent surgical history.  OB History   No obstetric history on file.      Home Medications    Prior to Admission medications   Medication Sig Start Date End Date Taking? Authorizing Provider  Multiple Vitamin (MULTIVITAMIN) tablet Take 1 tablet by mouth daily.   Yes [provider]  nortriptyline (PAMELOR) 25 MG capsule Take 2 capsules (50 mg total) by mouth at bedtime. 08/17/18  Yes Jaffe, Adam R, DO  rizatriptan (MAXALT) 10 MG tablet Take 10 mg by mouth as needed for migraine. May repeat in 2 hours if needed   Yes [provider]  topiramate (TOPAMAX) 200 MG tablet TAKE 1 TABLET BY MOUTH EVERY DAY 11/25/18  Yes Jaffe, Adam R, DO  topiramate (TOPAMAX) 50 MG tablet TAKE 1 TABLET BY MOUTH EVERY DAY 11/25/18  Yes Jaffe, Adam R, DO  benzonatate (TESSALON) 200 MG capsule Take 1 capsule (200 mg total)  by mouth 3 (three) times daily as needed for up to 7 days for cough. 02/08/19 02/15/19  Arthur Aydelotte C, PA-C  ciprofloxacin (CIPRO) 500 MG tablet Take 1 tablet (500 mg total) by mouth every 12 (twelve) hours for 7 days. 02/08/19 02/15/19  Tiyonna Sardinha C, PA-C  tamsulosin (FLOMAX) 0.4 MG CAPS capsule Take 1 capsule (0.4 mg total) by mouth daily. 02/08/19   Trinda Harlacher C, PA-C  tiZANidine (ZANAFLEX) 2 MG tablet Take 1 tablet (2 mg total) by mouth every 6 (six) hours as needed for muscle spasms. 12/02/18   Pieter Partridge, DO    Family History Family History  Problem Relation Age of Onset   Dementia Mother 16       in a memory care unit   Heart attack Father 69   Benign prostatic hyperplasia Brother    CVA Maternal Aunt     Social History Social History   Tobacco Use   Smoking status: Light Tobacco Smoker    Packs/day: 0.25    Types: Cigarettes   Smokeless tobacco: Never Used  Substance Use Topics   Alcohol use: Never    Frequency: Never   Drug use: Never     Allergies   Sulfa antibiotics   Review of Systems Review of Systems  Constitutional: Positive for fatigue and fever. Negative for activity change,  appetite change and chills.  HENT: Negative for congestion, ear pain, rhinorrhea, sinus pressure, sore throat and trouble swallowing.   Eyes: Negative for discharge and redness.  Respiratory: Positive for cough. Negative for chest tightness and shortness of breath.   Cardiovascular: Negative for chest pain.  Gastrointestinal: Negative for abdominal pain, diarrhea, nausea and vomiting.  Genitourinary: Positive for frequency and urgency. Negative for difficulty urinating and dysuria.  Musculoskeletal: Negative for myalgias.  Skin: Negative for rash.  Neurological: Negative for dizziness, light-headedness and headaches.     Physical Exam Triage Vital Signs ED Triage Vitals  Enc Vitals Group     BP 02/08/19 1819 113/61     Pulse Rate 02/08/19 1819 (!) 116      Resp 02/08/19 1819 18     Temp 02/08/19 1819 100.1 F (37.8 C)     Temp Source 02/08/19 1819 Oral     SpO2 02/08/19 1819 99 %     Weight --      Height --      Head Circumference --      Peak Flow --      Pain Score 02/08/19 1814 10     Pain Loc --      Pain Edu? --      Excl. in [place]? --    No data found.  Updated Vital Signs BP 113/61 (BP Location: Right Arm)    Pulse (!) 116    Temp 100.1 F (37.8 C) (Oral)    Resp 18    SpO2 99%   Visual Acuity Right Eye Distance:   Left Eye Distance:   Bilateral Distance:    Right Eye Near:   Left Eye Near:    Bilateral Near:     Physical Exam Vitals signs and nursing note reviewed.  Constitutional:      General: She is not in acute distress.    Appearance: She is well-developed.  HENT:     Head: Normocephalic and atraumatic.     Ears:     Comments: Bilateral ears without tenderness to palpation of external auricle, tragus and mastoid, EAC's without erythema or swelling, TM's with good bony landmarks and cone of light. Non erythematous.     Mouth/Throat:     Comments: Oral mucosa pink and moist, no tonsillar enlargement or exudate. Posterior pharynx patent and nonerythematous, no uvula deviation or swelling. Normal phonation. Eyes:     Conjunctiva/sclera: Conjunctivae normal.  Neck:     Musculoskeletal: Neck supple.  Cardiovascular:     Rate and Rhythm: Normal rate and regular rhythm.     Heart sounds: No murmur.  Pulmonary:     Effort: Pulmonary effort is normal. No respiratory distress.     Breath sounds: Normal breath sounds.     Comments: Breathing comfortably at rest, CTABL, no wheezing, rales or other adventitious sounds auscultated Abdominal:     Palpations: Abdomen is soft.     Tenderness: There is no abdominal tenderness.  Musculoskeletal:     Comments: Left lower back with tenderness to palpation in upper lumbar region, weakly positive CVA tenderness  Skin:    General: Skin is warm and dry.  Neurological:      Mental Status: She is alert.      UC Treatments / Results  Labs (all labs ordered are listed, but only abnormal results are displayed) Labs Reviewed  POCT URINALYSIS DIP (DEVICE) - Abnormal; Notable for the following components:      Result Value   Hgb urine  dipstick SMALL (*)    Protein, ur 100 (*)    Nitrite POSITIVE (*)    Leukocytes,Ua SMALL (*)    All other components within normal limits  URINE CULTURE    EKG None  Radiology No results found.  Procedures Procedures (including critical care time)  Medications Ordered in UC Medications  cefTRIAXone (ROCEPHIN) injection 1 g (1 g Intramuscular Given 02/08/19 1911)    Initial Impression / Assessment and Plan / UC Course  I have reviewed the triage vital signs and the nursing notes.  Pertinent labs & imaging results that were available during my care of the patient were reviewed by me and considered in my medical decision making (see chart for details).    Patient with small hemoglobin, positive nitrites and small leuks on UA, will send for culture.  Will treat for UTI/pyelonephritis given associated fevers and CVA tenderness.  Will provide Rocephin prior to discharge given fever and initiate on Cipro.  Discussed importance of not using tizanidine during Cipro use to avoid hypotension and dizziness.  Patient verbalized understanding with this.  Stated that she only uses this medicine as needed and feels she will be able to not take this over the next week.  History of kidney stones, small hemoglobin, possible underlying kidney stone contributing to UTI.  Will provide Flomax to use as needed.  Continue to monitor for resolution of symptoms.  Patient has had a cough for couple days, no other associated URI symptoms and does have fever, patient believes cough is improving, will so will attribute fever to UTI symptoms.  Will treat cough symptomatically at this time.  Continue to monitor, follow-up if developing worsening cough,  persistent fever or shortness of breath.  Discussed strict return precautions. Patient verbalized understanding and is agreeable with plan.   Final Clinical Impressions(s) / UC Diagnoses   Final diagnoses:  Pyelonephritis  Cough     Discharge Instructions     Urine showed evidence of infection. We are treating you with cipro twice daily for 1 week- please avoid using tizanidine during this time. Be sure to take full course. Stay hydrated- urine should be pale yellow to clear. My continue azo for relief of burning while infection is being cleared.   You may begin taking Flomax daily to help with any underlying kidney stone contributing to your symptoms  Use tessalon as needed for your cough, if you develop worsening cough, shortness of breath, persistent fevers, persistent back pain, symptoms not resolving with treatment for UTI please follow-up  Please return or follow up with your primary provider if symptoms not improving with treatment. Please return sooner if you have worsening of symptoms or develop fever, nausea, vomiting, abdominal pain, back pain, lightheadedness, dizziness.   ED Prescriptions    Medication Sig Dispense Auth. Provider   ciprofloxacin (CIPRO) 500 MG tablet Take 1 tablet (500 mg total) by mouth every 12 (twelve) hours for 7 days. 14 tablet Wadsworth Skolnick C, PA-C   tamsulosin (FLOMAX) 0.4 MG CAPS capsule Take 1 capsule (0.4 mg total) by mouth daily. 10 capsule Yuan Gann C, PA-C   benzonatate (TESSALON) 200 MG capsule Take 1 capsule (200 mg total) by mouth 3 (three) times daily as needed for up to 7 days for cough. 28 capsule Omya Winfield C, PA-C     Controlled Substance Prescriptions [place] Controlled Substance Registry consulted? Not Applicable   Janith Lima, [place] 02/08/19 1948

## 2019-02-08 NOTE — Discharge Instructions (Signed)
Urine showed evidence of infection. We are treating you with cipro twice daily for 1 week- please avoid using tizanidine during this time. Be sure to take full course. Stay hydrated- urine should be pale yellow to clear. My continue azo for relief of burning while infection is being cleared.   You may begin taking Flomax daily to help with any underlying kidney stone contributing to your symptoms  Use tessalon as needed for your cough, if you develop worsening cough, shortness of breath, persistent fevers, persistent back pain, symptoms not resolving with treatment for UTI please follow-up  Please return or follow up with your primary provider if symptoms not improving with treatment. Please return sooner if you have worsening of symptoms or develop fever, nausea, vomiting, abdominal pain, back pain, lightheadedness, dizziness.

## 2019-02-08 NOTE — ED Triage Notes (Signed)
Started coughing 2 days ago..  Non-productive cough, no sob.   Patient reports a fever of 101 yesterday.   Runny nose, but no more than usual.   Complains of back pain that started 2 days ago.  Left mid to lower back pain.  Area is painful to touch.  Patient repeatedly refers to kidney stones.  Denies pain with urination.  Urinating more frequently.

## 2019-02-09 ENCOUNTER — Other Ambulatory Visit: Payer: Self-pay | Admitting: Neurology

## 2019-02-11 ENCOUNTER — Telehealth (HOSPITAL_COMMUNITY): Payer: Self-pay | Admitting: Emergency Medicine

## 2019-02-11 LAB — URINE CULTURE

## 2019-02-11 NOTE — Telephone Encounter (Signed)
Urine culture was positive for e coli and was given  cipro at urgent care visit. Attempted to reach patient. No answer at this time.   

## 2019-02-24 DIAGNOSIS — H1031 Unspecified acute conjunctivitis, right eye: Secondary | ICD-10-CM | POA: Diagnosis not present

## 2019-03-23 DIAGNOSIS — Z85820 Personal history of malignant melanoma of skin: Secondary | ICD-10-CM | POA: Diagnosis not present

## 2019-03-23 DIAGNOSIS — L814 Other melanin hyperpigmentation: Secondary | ICD-10-CM | POA: Diagnosis not present

## 2019-03-23 DIAGNOSIS — L570 Actinic keratosis: Secondary | ICD-10-CM | POA: Diagnosis not present

## 2019-03-23 DIAGNOSIS — D2271 Melanocytic nevi of right lower limb, including hip: Secondary | ICD-10-CM | POA: Diagnosis not present

## 2019-03-23 DIAGNOSIS — D2262 Melanocytic nevi of left upper limb, including shoulder: Secondary | ICD-10-CM | POA: Diagnosis not present

## 2019-03-23 DIAGNOSIS — D225 Melanocytic nevi of trunk: Secondary | ICD-10-CM | POA: Diagnosis not present

## 2019-03-23 DIAGNOSIS — D2272 Melanocytic nevi of left lower limb, including hip: Secondary | ICD-10-CM | POA: Diagnosis not present

## 2019-03-23 DIAGNOSIS — Z85828 Personal history of other malignant neoplasm of skin: Secondary | ICD-10-CM | POA: Diagnosis not present

## 2019-03-23 DIAGNOSIS — L820 Inflamed seborrheic keratosis: Secondary | ICD-10-CM | POA: Diagnosis not present

## 2019-03-23 DIAGNOSIS — D485 Neoplasm of uncertain behavior of skin: Secondary | ICD-10-CM | POA: Diagnosis not present

## 2019-03-23 DIAGNOSIS — D2261 Melanocytic nevi of right upper limb, including shoulder: Secondary | ICD-10-CM | POA: Diagnosis not present

## 2019-03-23 DIAGNOSIS — D2239 Melanocytic nevi of other parts of face: Secondary | ICD-10-CM | POA: Diagnosis not present

## 2019-04-25 DIAGNOSIS — Z86010 Personal history of colonic polyps: Secondary | ICD-10-CM | POA: Diagnosis not present

## 2019-04-25 DIAGNOSIS — D126 Benign neoplasm of colon, unspecified: Secondary | ICD-10-CM | POA: Diagnosis not present

## 2019-04-25 DIAGNOSIS — K640 First degree hemorrhoids: Secondary | ICD-10-CM | POA: Diagnosis not present

## 2019-04-27 DIAGNOSIS — D126 Benign neoplasm of colon, unspecified: Secondary | ICD-10-CM | POA: Diagnosis not present

## 2019-05-19 ENCOUNTER — Other Ambulatory Visit: Payer: Self-pay | Admitting: Neurology

## 2019-06-01 NOTE — Progress Notes (Signed)
Virtual Visit via Video Note The purpose of this virtual visit is to provide medical care while limiting exposure to the novel coronavirus.    Consent was obtained for video visit:  Yes Answered questions that patient had about telehealth interaction:  Yes I discussed the limitations, risks, security and privacy concerns of performing an evaluation and management service by telemedicine. I also discussed with the patient that there may be a patient responsible charge related to this service. The patient expressed understanding and agreed to proceed.  Pt location: Home Physician Location: home Name of referring provider:  Hulan Fess, MD I connected with Deanna [name] at patients initiation/request on 06/02/2019 at  9:50 AM EDT by video enabled telemedicine application and verified that I am speaking with the correct person using two identifiers. Pt MRN:  [id number] Pt DOB:  [birth year] Video Participants:  Deanna [name];  husand  History of Present Illness:   Deanna [name] is a 68 year old right-handed female with mitral valve prolapse who follows up for migraines.  UPDATE: Mild headaches 4 times a month, lasts 2 - 2 1/2 hours Severe headache occurred twice over past 6 months, lasting few minutes with Zomig. Rescue protocol:  Excedrin for low-grade headache.  Zomig 5mg  NS Current NSAIDS: No Current analgesics: Excedrin Current triptans: Zomig 5 mg NS Current ergotamine: None Current anti-emetic: None Current muscle relaxants: tizanidine 2mg  (for neck soreness) Current anti-anxiolytic: None Current sleep aide: None Current Antihypertensive medications: None Current Antidepressant medications: Nortriptyline 50 mg Current Anticonvulsant medications: Topiramate 250 mg Current anti-CGRP: None Current Vitamins/Herbal/Supplements: Multivitamin Current Antihistamines/Decongestants: None Other therapy: None Hormone/birth control: none  Caffeine: Decaf coffee only Alcohol:  No Smoker: 5 to 6 cigarettes a day Diet: Does not skip meals.  Decaf coffee and decaf Pepsi.  Needs to increase water intake.  She reports decreased appetite and has lost about 20 lbs over past 6 months. 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 Depression: No; Anxiety: Some anxiety Other pain: No Sleep hygiene: Good  HISTORY: Onset:  Age 19 Location: right temporal/periorbital Quality:Non-throbbing Initial intensity:Mild to moderate.Shedenies new headache, thunderclap headache or severe headache that wakes herfrom sleep. Aura: no Prodrome: no Postdrome:no Associated symptoms: Photophobia, phonophobia, dizziness, infrequently nausea.She denies associatedvomiting, visual disturbance, autonomic symptoms,osmophobia or unilateral numbness or weakness. Initial duration:1 to 2 hours if takes medication at earliest onset Initial Frequency:8 days a month (4 mild, 3 moderate, 1 severe) Initial Frequency of abortive medication:as needed Triggers:  Allergies, skipping meals, sunlight/glare, change in weather, stress, certain scents Relieving factors:  Excedrin or Zomig Activity:Severe aggravates  Past NSAIDS: Aleve, ibuprofen Past analgesics: acetaminophen, BC powder, Goody powder, Excedrin Past abortive triptans: sumatriptan tablet, Maxalt, Relpax, Zomig NS (not covered by insurance) Past ergotamines: No Past muscle relaxants: Flexeril, Robaxin, Baclofen, Skelaxin Past anti-emetic:no Past antihypertensive medications:no Past antidepressant medications: imipramine Past anticonvulsant medications:No Past anti-CGRP: no Past vitamins/Herbal/Supplements:no Past antihistamines/decongestants: Allegra, Benadryl, Clarinex, Claritin, Dramamine, Flonase, pseudoephedrine Other past therapies: trigger point injections  Family history of headache:Paternal grandmother  Past Medical History: Past Medical History:  Diagnosis Date  . Headache      Medications: Outpatient Encounter Medications as of 06/02/2019  Medication Sig  . Multiple Vitamin (MULTIVITAMIN) tablet Take 1 tablet by mouth daily.  . nortriptyline (PAMELOR) 25 MG capsule TAKE 2 CAPSULES (50 MG TOTAL) BY MOUTH AT BEDTIME.  . rizatriptan (MAXALT) 10 MG tablet Take 10 mg by mouth as needed for migraine. May repeat in 2 hours if needed  . topiramate (TOPAMAX)  200 MG tablet TAKE 1 TABLET BY MOUTH EVERY DAY  . topiramate (TOPAMAX) 50 MG tablet TAKE 1 TABLET BY MOUTH EVERY DAY  . [DISCONTINUED] tamsulosin (FLOMAX) 0.4 MG CAPS capsule Take 1 capsule (0.4 mg total) by mouth daily.  . [DISCONTINUED] tiZANidine (ZANAFLEX) 2 MG tablet Take 1 tablet (2 mg total) by mouth every 6 (six) hours as needed for muscle spasms.   No facility-administered encounter medications on file as of 06/02/2019.     Allergies: Allergies  Allergen Reactions  . Sulfa Antibiotics     Family History: Family History  Problem Relation Age of Onset  . Dementia Mother 64       in a memory care unit  . Heart attack Father 3  . Benign prostatic hyperplasia Brother   . CVA Maternal Aunt     Social History: Social History   Socioeconomic History  . Marital status: Single    Spouse name: Merry Proud  . Number of children: 0  . Years of education: Not on file  . Highest education level: 12th grade  Occupational History  . Occupation: retired  Scientific laboratory technician  . Financial resource strain: Not on file  . Food insecurity    Worry: Not on file    Inability: Not on file  . Transportation needs    Medical: Not on file    Non-medical: Not on file  Tobacco Use  . Smoking status: Light Tobacco Smoker    Packs/day: 0.25    Types: Cigarettes  . Smokeless tobacco: Never Used  Substance and Sexual Activity  . Alcohol use: Never    Frequency: Never  . Drug use: Never  . Sexual activity: Not on file  Lifestyle  . Physical activity    Days per week: Not on file    Minutes per session: Not on file  .  Stress: Not on file  Relationships  . Social Herbalist on phone: Not on file    Gets together: Not on file    Attends religious service: Not on file    Active member of club or organization: Not on file    Attends meetings of clubs or organizations: Not on file    Relationship status: Not on file  . Intimate partner violence    Fear of current or ex partner: Not on file    Emotionally abused: Not on file    Physically abused: Not on file    Forced sexual activity: Not on file  Other Topics Concern  . Not on file  Social History Narrative   Patient is right-handed. She lives with her husband in a one story house with stairs to the basement. She avoids caffeine. She works on their farm.     Observations/Objective:   Height 5\' 3"  (1.6 m), weight 78 lb (35.4 kg). No acute distress.  Alert and oriented.  Speech fluent and not dysarthric.  Language intact.  Eyes orthophoric on primary gaze.  Face symmetric.  Assessment and Plan:   Migraine without aura, without status migrainosus, not intractable.  Due to weight loss, we will try to taper off of topiramate  1.  For preventative management, we will try Emgality.  She will decrease topiramate to 200mg  daily for now.  Once she is on the medication, we will start tapering off.  If for any reason she is not able to afford Emgality, she would like to try increasing nortriptyline.  In meantime, continue nortriptyline 50mg   2.  For abortive therapy,  Excedrin for low-grade headache, Zomig NS (she still has some available) for severe.   3.  Limit use of pain relievers to no more than 2 days out of week to prevent risk of rebound or medication-overuse headache. 4.  Keep headache diary 5.  Exercise, hydration, caffeine cessation, sleep hygiene, monitor for and avoid triggers 6.  Consider:  magnesium citrate 400mg  daily, riboflavin 400mg  daily, and coenzyme Q10 100mg  three times daily 7. Always keep in mind that currently taking a hormone or  birth control may be a possible trigger or aggravating factor for migraine. 8. Follow up 4 months   Follow Up Instructions:    -I discussed the assessment and treatment plan with the patient. The patient was provided an opportunity to ask questions and all were answered. The patient agreed with the plan and demonstrated an understanding of the instructions.   The patient was advised to call back or seek an in-person evaluation if the symptoms worsen or if the condition fails to improve as anticipated.     Dudley Major, DO

## 2019-06-02 ENCOUNTER — Telehealth (INDEPENDENT_AMBULATORY_CARE_PROVIDER_SITE_OTHER): Payer: PPO | Admitting: Neurology

## 2019-06-02 ENCOUNTER — Other Ambulatory Visit: Payer: Self-pay

## 2019-06-02 ENCOUNTER — Encounter: Payer: Self-pay | Admitting: Neurology

## 2019-06-02 VITALS — Ht 63.0 in | Wt 78.0 lb

## 2019-06-02 DIAGNOSIS — G43009 Migraine without aura, not intractable, without status migrainosus: Secondary | ICD-10-CM

## 2019-07-17 ENCOUNTER — Other Ambulatory Visit: Payer: Self-pay | Admitting: Neurology

## 2019-09-29 DIAGNOSIS — L814 Other melanin hyperpigmentation: Secondary | ICD-10-CM | POA: Diagnosis not present

## 2019-09-29 DIAGNOSIS — D485 Neoplasm of uncertain behavior of skin: Secondary | ICD-10-CM | POA: Diagnosis not present

## 2019-09-29 DIAGNOSIS — D2271 Melanocytic nevi of right lower limb, including hip: Secondary | ICD-10-CM | POA: Diagnosis not present

## 2019-09-29 DIAGNOSIS — Z85820 Personal history of malignant melanoma of skin: Secondary | ICD-10-CM | POA: Diagnosis not present

## 2019-09-29 DIAGNOSIS — L821 Other seborrheic keratosis: Secondary | ICD-10-CM | POA: Diagnosis not present

## 2019-09-29 DIAGNOSIS — Z85828 Personal history of other malignant neoplasm of skin: Secondary | ICD-10-CM | POA: Diagnosis not present

## 2019-09-29 DIAGNOSIS — D2372 Other benign neoplasm of skin of left lower limb, including hip: Secondary | ICD-10-CM | POA: Diagnosis not present

## 2019-09-29 DIAGNOSIS — D225 Melanocytic nevi of trunk: Secondary | ICD-10-CM | POA: Diagnosis not present

## 2019-09-29 DIAGNOSIS — D2272 Melanocytic nevi of left lower limb, including hip: Secondary | ICD-10-CM | POA: Diagnosis not present

## 2019-09-29 DIAGNOSIS — D2261 Melanocytic nevi of right upper limb, including shoulder: Secondary | ICD-10-CM | POA: Diagnosis not present

## 2019-09-29 DIAGNOSIS — D2262 Melanocytic nevi of left upper limb, including shoulder: Secondary | ICD-10-CM | POA: Diagnosis not present

## 2019-10-10 NOTE — Progress Notes (Signed)
Virtual Visit via Telephone Note The purpose of this virtual visit is to provide medical care while limiting exposure to the novel coronavirus.    Consent was obtained for phone visit:  Yes.   Answered questions that patient had about telehealth interaction:  Yes.   I discussed the limitations, risks, security and privacy concerns of performing an evaluation and management service by telephone. I also discussed with the patient that there may be a patient responsible charge related to this service. The patient expressed understanding and agreed to proceed.  Pt location: Home Physician Location: office Name of referring provider:  Hulan Fess, MD I connected with .[name] at patients initiation/request on 10/11/2019 at  9:50 AM EST by telephone and verified that I am speaking with the correct person using two identifiers.  Pt MRN:  [id number] Pt DOB:  11/09/1951   History of Present Illness:  Deanna [name] is a 69 year old right-handed female with mitral valve prolapse who follows up for migraines.  UPDATE: In July she was started on Emgality and topiramate was decreased to 200mg  at bedtime.  We considered Emgality but never started.  Some increased stress due to caring for her ill mother-in-law. Intensity:  Moderate to severe Duration:  2-2 1/2 hours Frequency:  7 or 8 headaches and 3 migraines in past 4 months. Rescue protocol:Excedrin for low-grade headache. Zomig 5mg  NS Current NSAIDS:No Current analgesics:Excedrin Current triptans:Zomig 5mg  NS Current ergotamine:None Current anti-emetic:None Current muscle relaxants:tizanidine 2mg  (for neck soreness) Current anti-anxiolytic:None Current sleep aide:None Current Antihypertensive medications:None Current Antidepressant medications:Nortriptyline 50 mg Current Anticonvulsant medications:Topiramate 200 mg Current anti-CGRP: none Current Vitamins/Herbal/Supplements:Multivitamin Current  Antihistamines/Decongestants:None Other therapy:None Hormone/birth control:none  Caffeine:Decaf coffee only Alcohol:No Smoker:5 to 6 cigarettes a day Diet:Does not skip meals. Decaf coffee and decaf Pepsi. Needs to increase water intake.  She has been eating more and has gained weight (now about 90 lbs). 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 Depression:No; Anxiety:Yes.  Caring for her ill mother-in-law. Other pain:No Sleep hygiene:Good  HISTORY: Onset:  Age 76 Location: right temporal/periorbital Quality:Non-throbbing Initial intensity:Mild to moderate.Shedenies new headache, thunderclap headache or severe headache that wakes herfrom sleep. Aura: no Prodrome: no Postdrome:no Associated symptoms: Photophobia, phonophobia, dizziness, infrequently nausea.She denies associatedvomiting, visual disturbance, autonomic symptoms,osmophobia or unilateral numbness or weakness. Initial duration:1 to 2 hours if takes medication at earliest onset InitialFrequency:8 days a month (4 mild, 3 moderate, 1 severe) InitialFrequency of abortive medication:as needed Triggers:  Allergies, skipping meals, sunlight/glare, change in weather, stress, certain scents Relieving factors:  Excedrin or Zomig Activity:Severe aggravates  Past NSAIDS: Aleve, ibuprofen Past analgesics: acetaminophen, BC powder, Goody powder, Excedrin Past abortive triptans: sumatriptan tablet, Maxalt, Relpax, Zomig NS (not covered by insurance) Past ergotamines: No Past muscle relaxants: Flexeril, Robaxin, Baclofen, Skelaxin Past anti-emetic:no Past antihypertensive medications:no Past antidepressant medications: imipramine Past anticonvulsant medications:No Past anti-CGRP: no Past vitamins/Herbal/Supplements:no Past antihistamines/decongestants: Allegra, Benadryl, Clarinex, Claritin, Dramamine, Flonase, pseudoephedrine Other past therapies: trigger point injections   Family history of headache:Paternal grandmother    Observations/Objective:   Height 5\' 3"  (1.6 m), weight 90 lb (40.8 kg). No acute distress.  Alert and oriented.  Speech fluent and not dysarthric.  Language intact.   Assessment and Plan:   Migraine without aura, without status migrainosus, not intractable  1.  For preventative management, continue topiramate 200mg  (weight is now stable/even gained weight) and nortriptyline 50mg  at bedtime 2.  For abortive therapy, Excedrin for low-grade headache, Zomig NS for severe headache 3.  Limit use  of pain relievers to no more than 2 days out of week to prevent risk of rebound or medication-overuse headache. 4.  Keep headache diary 5.  Exercise, hydration, caffeine cessation, sleep hygiene, monitor for and avoid triggers 6.  Consider:  magnesium citrate 400mg  daily, riboflavin 400mg  daily, and coenzyme Q10 100mg  three times daily 7. Follow up 6 months    Follow Up Instructions:    -I discussed the assessment and treatment plan with the patient. The patient was provided an opportunity to ask questions and all were answered. The patient agreed with the plan and demonstrated an understanding of the instructions.   The patient was advised to call back or seek an in-person evaluation if the symptoms worsen or if the condition fails to improve as anticipated.    Total Time spent in visit with the patient was:  11 minutes  Dudley Major, DO

## 2019-10-11 ENCOUNTER — Other Ambulatory Visit: Payer: Self-pay

## 2019-10-11 ENCOUNTER — Encounter: Payer: Self-pay | Admitting: Neurology

## 2019-10-11 ENCOUNTER — Telehealth (INDEPENDENT_AMBULATORY_CARE_PROVIDER_SITE_OTHER): Payer: PPO | Admitting: Neurology

## 2019-10-11 VITALS — Ht 63.0 in | Wt 90.0 lb

## 2019-10-11 DIAGNOSIS — G43009 Migraine without aura, not intractable, without status migrainosus: Secondary | ICD-10-CM

## 2019-11-19 ENCOUNTER — Other Ambulatory Visit: Payer: Self-pay | Admitting: Neurology

## 2019-11-21 DIAGNOSIS — Z20822 Contact with and (suspected) exposure to covid-19: Secondary | ICD-10-CM | POA: Diagnosis not present

## 2020-03-28 DIAGNOSIS — Z85820 Personal history of malignant melanoma of skin: Secondary | ICD-10-CM | POA: Diagnosis not present

## 2020-03-28 DIAGNOSIS — D2262 Melanocytic nevi of left upper limb, including shoulder: Secondary | ICD-10-CM | POA: Diagnosis not present

## 2020-03-28 DIAGNOSIS — D2261 Melanocytic nevi of right upper limb, including shoulder: Secondary | ICD-10-CM | POA: Diagnosis not present

## 2020-03-28 DIAGNOSIS — D2271 Melanocytic nevi of right lower limb, including hip: Secondary | ICD-10-CM | POA: Diagnosis not present

## 2020-03-28 DIAGNOSIS — Z85828 Personal history of other malignant neoplasm of skin: Secondary | ICD-10-CM | POA: Diagnosis not present

## 2020-03-28 DIAGNOSIS — D2272 Melanocytic nevi of left lower limb, including hip: Secondary | ICD-10-CM | POA: Diagnosis not present

## 2020-03-28 DIAGNOSIS — D225 Melanocytic nevi of trunk: Secondary | ICD-10-CM | POA: Diagnosis not present

## 2020-03-28 DIAGNOSIS — L821 Other seborrheic keratosis: Secondary | ICD-10-CM | POA: Diagnosis not present

## 2020-03-28 DIAGNOSIS — D692 Other nonthrombocytopenic purpura: Secondary | ICD-10-CM | POA: Diagnosis not present

## 2020-04-06 NOTE — Progress Notes (Signed)
NEUROLOGY FOLLOW UP OFFICE NOTE  Deanna [name] [id number]  HISTORY OF PRESENT ILLNESS: Deanna [name] is a 69 year old right-handed female with mitral valve prolapse whofollows up for migraines.  UPDATE: Patient's mother-in-law passed away.  She had been sick since a series of strokes last summer. Intensity:  Moderate to severe Duration:  2-2 1/2 hours Frequency:  No migraines in past 30 days  Mild headache "once in a while" Rescue protocol:Excedrin for low-grade headache. None Current NSAIDS:No Current analgesics:Excedrin Current triptans:None Current ergotamine:None Current anti-emetic:None Current muscle relaxants:tizanidine 2mg  (for neck soreness) Current anti-anxiolytic:None Current sleep aide:None Current Antihypertensive medications:None Current Antidepressant medications:Nortriptyline 50 mg Current Anticonvulsant medications:Topiramate200 mg Current anti-CGRP: none Current Vitamins/Herbal/Supplements:Multivitamin Current Antihistamines/Decongestants:None Other therapy:None Hormone/birth control:none  Caffeine:Decaf coffee only Alcohol:No Smoker:5 to 6 cigarettes a day Diet:Does not skip meals. Decaf coffee and decaf Pepsi. Needs to increase water intake.She has been eating more and has gained weight (now about 90 lbs). 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 Depression:No; Anxiety:Yes.  Caring for her ill mother-in-law. Other pain:No Sleep hygiene:Good  HISTORY: Onset: Age 60 Location: right temporal/periorbital Quality:Non-throbbing Initial intensity:Mild to moderate.Shedenies new headache, thunderclap headache or severe headache that wakes herfrom sleep. Aura: no Prodrome: no Postdrome:no Associated symptoms:Photophobia, phonophobia, dizziness, infrequently nausea.She denies associatedvomiting, visual disturbance, autonomic symptoms,osmophobia or unilateral numbness or weakness. Initial  duration:1 to 2 hours if takes medication at earliest onset InitialFrequency:8 days a month (4 mild, 3 moderate, 1 severe) InitialFrequency of abortive medication:as needed Triggers: Allergies, skipping meals, sunlight/glare, change in weather, stress, certain scents Relieving factors: Excedrinor Zomig Activity:Severe aggravates  Past NSAIDS: Aleve, ibuprofen Past analgesics: acetaminophen, BC powder, Goody powder, Excedrin Past abortive triptans: sumatriptan tablet, Maxalt, Relpax, Zomig NS (not covered by insurance) Past ergotamines: No Past muscle relaxants: Flexeril, Robaxin, Baclofen, Skelaxin Past anti-emetic:no Past antihypertensive medications:no Past antidepressant medications: imipramine Past anticonvulsant medications:No Past anti-CGRP: no Past vitamins/Herbal/Supplements:no Past antihistamines/decongestants: Allegra, Benadryl, Clarinex, Claritin, Dramamine, Flonase, pseudoephedrine Other past therapies: trigger point injections  Family history of headache:Paternal grandmother  PAST MEDICAL HISTORY: Past Medical History:  Diagnosis Date  . Headache     MEDICATIONS: Current Outpatient Medications on File Prior to Visit  Medication Sig Dispense Refill  . Multiple Vitamin (MULTIVITAMIN) tablet Take 1 tablet by mouth daily.    . nortriptyline (PAMELOR) 25 MG capsule TAKE 2 CAPSULES (50 MG TOTAL) BY MOUTH AT BEDTIME. 180 capsule 1  . rizatriptan (MAXALT) 10 MG tablet Take 10 mg by mouth as needed for migraine. May repeat in 2 hours if needed    . topiramate (TOPAMAX) 200 MG tablet TAKE 1 TABLET BY MOUTH EVERY DAY 90 tablet 1   No current facility-administered medications on file prior to visit.    ALLERGIES: Allergies  Allergen Reactions  . Sulfa Antibiotics     FAMILY HISTORY: Family History  Problem Relation Age of Onset  . Dementia Mother 85       in a memory care unit  . Heart attack Father 71  . Benign prostatic  hyperplasia Brother   . CVA Maternal Aunt     SOCIAL HISTORY: Social History   Socioeconomic History  . Marital status: Married    Spouse name: Merry Proud  . Number of children: 0  . Years of education: Not on file  . Highest education level: 12th grade  Occupational History  . Occupation: retired  Tobacco Use  . Smoking status: Light Tobacco Smoker    Packs/day: 0.25    Types: Cigarettes  . Smokeless tobacco:  Never Used  Substance and Sexual Activity  . Alcohol use: Never  . Drug use: Never  . Sexual activity: Not on file  Other Topics Concern  . Not on file  Social History Narrative   Patient is right-handed. She lives with her husband in a one story house with stairs to the basement. She avoids caffeine. She works on their farm.    Social Determinants of Health   Financial Resource Strain:   . Difficulty of Paying Living Expenses:   Food Insecurity:   . Worried About Charity fundraiser in the Last Year:   . Arboriculturist in the Last Year:   Transportation Needs:   . Film/video editor (Medical):   Marland Kitchen Lack of Transportation (Non-Medical):   Physical Activity:   . Days of Exercise per Week:   . Minutes of Exercise per Session:   Stress:   . Feeling of Stress :   Social Connections:   . Frequency of Communication with Friends and Family:   . Frequency of Social Gatherings with Friends and Family:   . Attends Religious Services:   . Active Member of Clubs or Organizations:   . Attends Archivist Meetings:   Marland Kitchen Marital Status:   Intimate Partner Violence:   . Fear of Current or Ex-Partner:   . Emotionally Abused:   Marland Kitchen Physically Abused:   . Sexually Abused:     PHYSICAL EXAM: Blood pressure (!) 194/93, pulse 73, height 5\' 3"  (1.6 m), weight 94 lb 9.6 oz (42.9 kg), SpO2 100 %. General: No acute distress.  Patient appears well-groomed.   Head:  Normocephalic/atraumatic Eyes:  Fundi examined but not visualized Neck: supple, no paraspinal tenderness,  full range of motion Heart:  Regular rate and rhythm Lungs:  Clear to auscultation bilaterally Back: No paraspinal tenderness Neurological Exam: alert and oriented to person, place, and time. Attention span and concentration intact, recent and remote memory intact, fund of knowledge intact.  Speech fluent and not dysarthric, language intact.  CN II-XII intact. Bulk and tone normal, muscle strength 5/5 throughout.  Sensation to light touch, temperature and vibration intact.  Deep tendon reflexes 2+ throughout, toes downgoing.  Finger to nose and heel to shin testing intact.  Gait normal, Romberg negative.  IMPRESSION: Migraine without aura, without status migrainosus, not intractable Elevated blood pressure  PLAN: 1.  For preventative management:  topiramate 200mg  at bedtime; nortriptyline 50mg  at bedtime 2.  For abortive therapy:  Excedrin.  Due to age and stroke risk factors (smoker), advised to avoid triptans if possible. 3.  Limit use of pain relievers to no more than 2 days out of week to prevent risk of rebound or medication-overuse headache. 4.  Keep headache diary 5.  Exercise, hydration, caffeine cessation, sleep hygiene, monitor for and avoid triggers 6. Contact PCP regarding blood pressure 7. Follow up one year   Metta Clines, DO  CC: Hulan Fess, MD

## 2020-04-10 ENCOUNTER — Ambulatory Visit: Payer: Medicare HMO | Admitting: Neurology

## 2020-04-10 ENCOUNTER — Encounter: Payer: Self-pay | Admitting: Neurology

## 2020-04-10 ENCOUNTER — Other Ambulatory Visit: Payer: Self-pay

## 2020-04-10 VITALS — BP 194/93 | HR 73 | Ht 63.0 in | Wt 94.6 lb

## 2020-04-10 DIAGNOSIS — R030 Elevated blood-pressure reading, without diagnosis of hypertension: Secondary | ICD-10-CM | POA: Diagnosis not present

## 2020-04-10 DIAGNOSIS — G43009 Migraine without aura, not intractable, without status migrainosus: Secondary | ICD-10-CM

## 2020-04-10 NOTE — Patient Instructions (Signed)
1.  Continue nortriptyline and topiramate 2.  Try not to use rizatriptan or Zomig 3.  Limit use of pain relievers to no more than 2 days out of week to prevent risk of rebound or medication-overuse headache. 4.  Follow up in one year   Migraine Headache A migraine headache is a very strong throbbing pain on one side or both sides of your head. This type of headache can also cause other symptoms. It can last from 4 hours to 3 days. Talk with your doctor about what things may bring on (trigger) this condition. What are the causes? The exact cause of this condition is not known. This condition may be triggered or caused by:  Drinking alcohol.  Smoking.  Taking medicines, such as: ? Medicine used to treat chest pain (nitroglycerin). ? Birth control pills. ? Estrogen. ? Some blood pressure medicines.  Eating or drinking certain products.  Doing physical activity. Other things that may trigger a migraine headache include:  Having a menstrual period.  Pregnancy.  Hunger.  Stress.  Not getting enough sleep or getting too much sleep.  Weather changes.  Tiredness (fatigue). What increases the risk?  Being 8-72 years old.  Being female.  Having a family history of migraine headaches.  Being Caucasian.  Having depression or anxiety.  Being very overweight. What are the signs or symptoms?  A throbbing pain. This pain may: ? Happen in any area of the head, such as on one side or both sides. ? Make it hard to do daily activities. ? Get worse with physical activity. ? Get worse around bright lights or loud noises.  Other symptoms may include: ? Feeling sick to your stomach (nauseous). ? Vomiting. ? Dizziness. ? Being sensitive to bright lights, loud noises, or smells.  Before you get a migraine headache, you may get warning signs (an aura). An aura may include: ? Seeing flashing lights or having blind spots. ? Seeing bright spots, halos, or zigzag lines. ? Having  tunnel vision or blurred vision. ? Having numbness or a tingling feeling. ? Having trouble talking. ? Having weak muscles.  Some people have symptoms after a migraine headache (postdromal phase), such as: ? Tiredness. ? Trouble thinking (concentrating). How is this treated?  Taking medicines that: ? Relieve pain. ? Relieve the feeling of being sick to your stomach. ? Prevent migraine headaches.  Treatment may also include: ? Having acupuncture. ? Avoiding foods that bring on migraine headaches. ? Learning ways to control your body functions (biofeedback). ? Therapy to help you know and deal with negative thoughts (cognitive behavioral therapy). Follow these instructions at home: Medicines  Take over-the-counter and prescription medicines only as told by your doctor.  Ask your doctor if the medicine prescribed to you: ? Requires you to avoid driving or using heavy machinery. ? Can cause trouble pooping (constipation). You may need to take these steps to prevent or treat trouble pooping:  Drink enough fluid to keep your pee (urine) pale yellow.  Take over-the-counter or prescription medicines.  Eat foods that are high in fiber. These include beans, whole grains, and fresh fruits and vegetables.  Limit foods that are high in fat and sugar. These include fried or sweet foods. Lifestyle  Do not drink alcohol.  Do not use any products that contain nicotine or tobacco, such as cigarettes, e-cigarettes, and chewing tobacco. If you need help quitting, ask your doctor.  Get at least 8 hours of sleep every night.  Limit and deal with  stress. General instructions      Keep a journal to find out what may bring on your migraine headaches. For example, write down: ? What you eat and drink. ? How much sleep you get. ? Any change in what you eat or drink. ? Any change in your medicines.  If you have a migraine headache: ? Avoid things that make your symptoms worse, such as  bright lights. ? It may help to lie down in a dark, quiet room. ? Do not drive or use heavy machinery. ? Ask your doctor what activities are safe for you.  Keep all follow-up visits as told by your doctor. This is important. Contact a doctor if:  You get a migraine headache that is different or worse than others you have had.  You have more than 15 headache days in one month. Get help right away if:  Your migraine headache gets very bad.  Your migraine headache lasts longer than 72 hours.  You have a fever.  You have a stiff neck.  You have trouble seeing.  Your muscles feel weak or like you cannot control them.  You start to lose your balance a lot.  You start to have trouble walking.  You pass out (faint).  You have a seizure. Summary  A migraine headache is a very strong throbbing pain on one side or both sides of your head. These headaches can also cause other symptoms.  This condition may be treated with medicines and changes to your lifestyle.  Keep a journal to find out what may bring on your migraine headaches.  Contact a doctor if you get a migraine headache that is different or worse than others you have had.  Contact your doctor if you have more than 15 headache days in a month. This information is not intended to replace advice given to you by your health care provider. Make sure you discuss any questions you have with your health care provider. Document Revised: 02/18/2019 Document Reviewed: 12/09/2018 Elsevier Patient Education  [employer].

## 2020-06-29 ENCOUNTER — Other Ambulatory Visit: Payer: Self-pay

## 2020-06-29 MED ORDER — TOPIRAMATE 200 MG PO TABS: 200.0000 mg | ORAL_TABLET | Freq: Every day | ORAL | 1 refills | Status: DC

## 2020-06-29 MED ORDER — NORTRIPTYLINE HCL 25 MG PO CAPS: 50.0000 mg | ORAL_CAPSULE | Freq: Every day | ORAL | 1 refills | Status: DC

## 2020-07-09 ENCOUNTER — Other Ambulatory Visit: Payer: Self-pay | Admitting: Neurology

## 2020-08-09 DIAGNOSIS — H25093 Other age-related incipient cataract, bilateral: Secondary | ICD-10-CM | POA: Diagnosis not present

## 2020-08-09 DIAGNOSIS — H35363 Drusen (degenerative) of macula, bilateral: Secondary | ICD-10-CM | POA: Diagnosis not present

## 2020-08-09 DIAGNOSIS — H5203 Hypermetropia, bilateral: Secondary | ICD-10-CM | POA: Diagnosis not present

## 2020-08-09 DIAGNOSIS — H52223 Regular astigmatism, bilateral: Secondary | ICD-10-CM | POA: Diagnosis not present

## 2020-08-09 DIAGNOSIS — H524 Presbyopia: Secondary | ICD-10-CM | POA: Diagnosis not present

## 2020-10-12 DIAGNOSIS — L720 Epidermal cyst: Secondary | ICD-10-CM | POA: Diagnosis not present

## 2020-10-12 DIAGNOSIS — D2261 Melanocytic nevi of right upper limb, including shoulder: Secondary | ICD-10-CM | POA: Diagnosis not present

## 2020-10-12 DIAGNOSIS — D2262 Melanocytic nevi of left upper limb, including shoulder: Secondary | ICD-10-CM | POA: Diagnosis not present

## 2020-10-12 DIAGNOSIS — L821 Other seborrheic keratosis: Secondary | ICD-10-CM | POA: Diagnosis not present

## 2020-10-12 DIAGNOSIS — Z85828 Personal history of other malignant neoplasm of skin: Secondary | ICD-10-CM | POA: Diagnosis not present

## 2020-10-12 DIAGNOSIS — D692 Other nonthrombocytopenic purpura: Secondary | ICD-10-CM | POA: Diagnosis not present

## 2020-10-12 DIAGNOSIS — D224 Melanocytic nevi of scalp and neck: Secondary | ICD-10-CM | POA: Diagnosis not present

## 2020-10-12 DIAGNOSIS — D2272 Melanocytic nevi of left lower limb, including hip: Secondary | ICD-10-CM | POA: Diagnosis not present

## 2020-10-12 DIAGNOSIS — D225 Melanocytic nevi of trunk: Secondary | ICD-10-CM | POA: Diagnosis not present

## 2021-02-19 ENCOUNTER — Telehealth: Payer: Self-pay | Admitting: Neurology

## 2021-02-19 MED ORDER — NORTRIPTYLINE HCL 25 MG PO CAPS: 50.0000 mg | ORAL_CAPSULE | Freq: Every day | ORAL | 1 refills | Status: DC

## 2021-02-19 MED ORDER — TOPIRAMATE 200 MG PO TABS: 200.0000 mg | ORAL_TABLET | Freq: Every day | ORAL | 1 refills | Status: DC

## 2021-02-19 NOTE — Telephone Encounter (Signed)
Per pt request refills sent to [facility]. Enough to last until her appt 04/10/21

## 2021-02-19 NOTE — Telephone Encounter (Signed)
Patient called in stating she has changed insurances and has to get her medications at [facility] now. She will need a new prescription for the nortriptyline and topiramate sent to the [facility] in [place].

## 2021-04-09 NOTE — Progress Notes (Signed)
NEUROLOGY FOLLOW UP OFFICE NOTE  [name] [id number]  Assessment/Plan:   Migraine without aura, without status migrainosus, not intractable  1.  Migraine prevention:  topiramate 200mg  QHS, nortriptyline 50mg  QHS 2.  Migraine rescue:  Excedrin for low-grade headache, rizatriptan 10mg  for severe headache - she uses sparingly.  We discussed discontinuing triptans due to her age but she says she rarely takes it and would like to continue for now. 3.  Limit use of pain relievers to no more than 2 days out of week to prevent risk of rebound or medication-overuse headache. 4.  Keep headache diary 5.  Follow up with PCP regarding blood pressure 6.  Follow up one year  Subjective:  Deanna [name] is a 70 year old right-handed female with mitral valve prolapse whofollows up for migraines.  UPDATE: Intensity: Moderate to severe Duration: 2 hours Frequency: 2 migraines in past month (1 mild and 1 severe) Rescue protocol:Excedrin for low-grade headache. Rizatriptan for severe headache. Current NSAIDS:No Current analgesics:Excedrin Current triptans:Rizatriptan 10mg  Current ergotamine:None Current anti-emetic:None Current muscle relaxants:tizanidine 2mg  (for neck soreness) Current anti-anxiolytic:None Current sleep aide:None Current Antihypertensive medications:None Current Antidepressant medications:Nortriptyline 50 mg Current Anticonvulsant medications:Topiramate200 mg Current anti-CGRP:none Current Vitamins/Herbal/Supplements:Multivitamin Current Antihistamines/Decongestants:None Other therapy:None Hormone/birth control:none  Caffeine:Decaf coffee only Alcohol:No Smoker:5 to 6 cigarettes a day Diet:Does not skip meals. Decaf coffee and decaf Pepsi. Needs to increase water intake.She has been eating more and has gained weight (now about 90 lbs). 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 Depression:No; Anxiety:Yes. Caring for her ill  mother-in-law. Other pain:No Sleep hygiene:Good  HISTORY: Onset: Age 70 Location: right temporal/periorbital Quality:Non-throbbing Initial intensity:Mild to moderate.Shedenies new headache, thunderclap headache or severe headache that wakes herfrom sleep. Aura: no Prodrome: no Postdrome:no Associated symptoms:Photophobia, phonophobia, dizziness, infrequently nausea.She denies associatedvomiting, visual disturbance, autonomic symptoms,osmophobia or unilateral numbness or weakness. Initial duration:1 to 2 hours if takes medication at earliest onset InitialFrequency:8 days a month (4 mild, 3 moderate, 1 severe) InitialFrequency of abortive medication:as needed Triggers: Allergies, skipping meals, sunlight/glare, change in weather, stress, certain scents Relieving factors: Excedrinor Zomig Activity:Severe aggravates  Past NSAIDS: Aleve, ibuprofen Past analgesics: acetaminophen, BC powder, Goody powder, Excedrin Past abortive triptans: sumatriptan tablet, Maxalt, Relpax, Zomig NS (not covered by insurance) Past ergotamines: No Past muscle relaxants: Flexeril, Robaxin, Baclofen, Skelaxin Past anti-emetic:no Past antihypertensive medications:no Past antidepressant medications: imipramine Past anticonvulsant medications:No Past anti-CGRP: no Past vitamins/Herbal/Supplements:no Past antihistamines/decongestants: Allegra, Benadryl, Clarinex, Claritin, Dramamine, Flonase, pseudoephedrine Other past therapies: trigger point injections  Family history of headache:Paternal grandmother  PAST MEDICAL HISTORY: Past Medical History:  Diagnosis Date  . Headache     MEDICATIONS: Current Outpatient Medications on File Prior to Visit  Medication Sig Dispense Refill  . Multiple Vitamin (MULTIVITAMIN) tablet Take 1 tablet by mouth daily.    . nortriptyline (PAMELOR) 25 MG capsule Take 2 capsules (50 mg total) by mouth at bedtime. 60 capsule  1  . rizatriptan (MAXALT) 10 MG tablet Take 10 mg by mouth as needed for migraine. May repeat in 2 hours if needed    . topiramate (TOPAMAX) 200 MG tablet Take 1 tablet (200 mg total) by mouth daily. 30 tablet 1   No current facility-administered medications on file prior to visit.    ALLERGIES: Allergies  Allergen Reactions  . Sulfa Antibiotics     FAMILY HISTORY: Family History  Problem Relation Age of Onset  . Dementia Mother 69       in a memory care unit  . Heart attack Father  37  . Benign prostatic hyperplasia Brother   . CVA Maternal Aunt       Objective:  Blood pressure (!) 163/88, pulse 82, height 5\' 2"  (1.575 m), weight 92 lb (41.7 kg), SpO2 99 %. General: No acute distress.  Patient appears well-groomed.   Head:  Normocephalic/atraumatic Eyes:  Fundi examined but not visualized Neck: supple, no paraspinal tenderness, full range of motion Heart:  Regular rate and rhythm Lungs:  Clear to auscultation bilaterally Back: No paraspinal tenderness Neurological Exam: alert and oriented to person, place, and time.  Speech fluent and not dysarthric, language intact.  CN II-XII intact. Bulk and tone normal, muscle strength 5/5 throughout.  Sensation to temperature and vibration intact.  Deep tendon reflexes 2+ throughout.  Finger to nose testing intact.  Gait normal, Romberg negative.   Metta Clines, DO  CC: Hulan Fess, MD

## 2021-04-10 ENCOUNTER — Ambulatory Visit: Payer: PPO | Admitting: Neurology

## 2021-04-10 ENCOUNTER — Other Ambulatory Visit: Payer: Self-pay

## 2021-04-10 ENCOUNTER — Encounter: Payer: Self-pay | Admitting: Neurology

## 2021-04-10 VITALS — BP 163/88 | HR 82 | Ht 62.0 in | Wt 92.0 lb

## 2021-04-10 DIAGNOSIS — G43009 Migraine without aura, not intractable, without status migrainosus: Secondary | ICD-10-CM | POA: Diagnosis not present

## 2021-04-10 NOTE — Patient Instructions (Signed)
1.  Continue topiramate 200mg  and nortriptyline 50mg  at bedtime 2.  Excedrin or rizatriptan as needed.  Limit use of pain relievers to no more than 2 days out of week to prevent risk of rebound or medication-overuse headache. 3.  Keep headache diary 4.  Follow up one year

## 2021-04-17 ENCOUNTER — Other Ambulatory Visit: Payer: Self-pay | Admitting: Neurology

## 2021-05-13 ENCOUNTER — Other Ambulatory Visit: Payer: Self-pay | Admitting: Neurology

## 2021-08-02 DIAGNOSIS — Z0000 Encounter for general adult medical examination without abnormal findings: Secondary | ICD-10-CM | POA: Diagnosis not present

## 2021-08-02 DIAGNOSIS — Z23 Encounter for immunization: Secondary | ICD-10-CM | POA: Diagnosis not present

## 2021-08-02 DIAGNOSIS — Z13220 Encounter for screening for lipoid disorders: Secondary | ICD-10-CM | POA: Diagnosis not present

## 2021-08-02 DIAGNOSIS — R030 Elevated blood-pressure reading, without diagnosis of hypertension: Secondary | ICD-10-CM | POA: Diagnosis not present

## 2021-08-02 DIAGNOSIS — Z681 Body mass index (BMI) 19 or less, adult: Secondary | ICD-10-CM | POA: Diagnosis not present

## 2021-08-02 DIAGNOSIS — E2839 Other primary ovarian failure: Secondary | ICD-10-CM | POA: Diagnosis not present

## 2021-08-02 DIAGNOSIS — Z86010 Personal history of colonic polyps: Secondary | ICD-10-CM | POA: Diagnosis not present

## 2021-08-02 DIAGNOSIS — G43009 Migraine without aura, not intractable, without status migrainosus: Secondary | ICD-10-CM | POA: Diagnosis not present

## 2021-08-02 DIAGNOSIS — E782 Mixed hyperlipidemia: Secondary | ICD-10-CM | POA: Diagnosis not present

## 2021-08-02 DIAGNOSIS — I83893 Varicose veins of bilateral lower extremities with other complications: Secondary | ICD-10-CM | POA: Diagnosis not present

## 2021-08-09 DIAGNOSIS — E782 Mixed hyperlipidemia: Secondary | ICD-10-CM | POA: Diagnosis not present

## 2021-08-09 DIAGNOSIS — R6889 Other general symptoms and signs: Secondary | ICD-10-CM | POA: Diagnosis not present

## 2021-08-09 DIAGNOSIS — I10 Essential (primary) hypertension: Secondary | ICD-10-CM | POA: Diagnosis not present

## 2021-08-12 ENCOUNTER — Encounter: Payer: Self-pay | Admitting: Psychology

## 2021-10-18 ENCOUNTER — Other Ambulatory Visit: Payer: Self-pay | Admitting: Neurology

## 2021-11-05 DIAGNOSIS — E2839 Other primary ovarian failure: Secondary | ICD-10-CM | POA: Diagnosis not present

## 2021-11-05 DIAGNOSIS — G43009 Migraine without aura, not intractable, without status migrainosus: Secondary | ICD-10-CM | POA: Diagnosis not present

## 2021-11-05 DIAGNOSIS — I10 Essential (primary) hypertension: Secondary | ICD-10-CM | POA: Diagnosis not present

## 2021-11-05 DIAGNOSIS — E782 Mixed hyperlipidemia: Secondary | ICD-10-CM | POA: Diagnosis not present

## 2021-11-05 DIAGNOSIS — R7309 Other abnormal glucose: Secondary | ICD-10-CM | POA: Diagnosis not present

## 2021-12-05 DIAGNOSIS — M810 Age-related osteoporosis without current pathological fracture: Secondary | ICD-10-CM | POA: Diagnosis not present

## 2021-12-05 DIAGNOSIS — Z1231 Encounter for screening mammogram for malignant neoplasm of breast: Secondary | ICD-10-CM | POA: Diagnosis not present

## 2021-12-05 DIAGNOSIS — M8589 Other specified disorders of bone density and structure, multiple sites: Secondary | ICD-10-CM | POA: Diagnosis not present

## 2021-12-05 DIAGNOSIS — Z780 Asymptomatic menopausal state: Secondary | ICD-10-CM | POA: Diagnosis not present

## 2021-12-19 DIAGNOSIS — N6311 Unspecified lump in the right breast, upper outer quadrant: Secondary | ICD-10-CM | POA: Diagnosis not present

## 2021-12-19 DIAGNOSIS — R921 Mammographic calcification found on diagnostic imaging of breast: Secondary | ICD-10-CM | POA: Diagnosis not present

## 2022-01-02 ENCOUNTER — Other Ambulatory Visit: Payer: Self-pay

## 2022-01-02 DIAGNOSIS — N6311 Unspecified lump in the right breast, upper outer quadrant: Secondary | ICD-10-CM | POA: Diagnosis not present

## 2022-01-02 DIAGNOSIS — D241 Benign neoplasm of right breast: Secondary | ICD-10-CM | POA: Diagnosis not present

## 2022-01-13 ENCOUNTER — Encounter: Payer: Self-pay | Admitting: Psychology

## 2022-01-13 DIAGNOSIS — G43909 Migraine, unspecified, not intractable, without status migrainosus: Secondary | ICD-10-CM | POA: Insufficient documentation

## 2022-01-13 DIAGNOSIS — E782 Mixed hyperlipidemia: Secondary | ICD-10-CM | POA: Insufficient documentation

## 2022-01-13 DIAGNOSIS — Z860100 Personal history of colon polyps, unspecified: Secondary | ICD-10-CM | POA: Insufficient documentation

## 2022-01-13 DIAGNOSIS — M810 Age-related osteoporosis without current pathological fracture: Secondary | ICD-10-CM | POA: Insufficient documentation

## 2022-01-13 DIAGNOSIS — E2839 Other primary ovarian failure: Secondary | ICD-10-CM | POA: Insufficient documentation

## 2022-01-13 DIAGNOSIS — I83893 Varicose veins of bilateral lower extremities with other complications: Secondary | ICD-10-CM | POA: Insufficient documentation

## 2022-01-13 DIAGNOSIS — I10 Essential (primary) hypertension: Secondary | ICD-10-CM | POA: Insufficient documentation

## 2022-01-14 ENCOUNTER — Ambulatory Visit: Payer: PPO | Admitting: Psychology

## 2022-01-14 ENCOUNTER — Ambulatory Visit (INDEPENDENT_AMBULATORY_CARE_PROVIDER_SITE_OTHER): Payer: PPO | Admitting: Psychology

## 2022-01-14 ENCOUNTER — Encounter: Payer: Self-pay | Admitting: Psychology

## 2022-01-14 ENCOUNTER — Other Ambulatory Visit: Payer: Self-pay

## 2022-01-14 DIAGNOSIS — G3184 Mild cognitive impairment, so stated: Secondary | ICD-10-CM

## 2022-01-14 DIAGNOSIS — R4189 Other symptoms and signs involving cognitive functions and awareness: Secondary | ICD-10-CM

## 2022-01-14 HISTORY — DX: Mild cognitive impairment of uncertain or unknown etiology: G31.84

## 2022-01-14 NOTE — Progress Notes (Signed)
? ?  Psychometrician Note ?  ?Cognitive testing was administered to Deanna [name] by Milana Kidney, B.S. (psychometrist) under the supervision of Dr. Christia Reading, Ph.D., licensed psychologist on 01/14/22. Deanna [name] did not appear overtly distressed by the testing session per behavioral observation or responses across self-report questionnaires. Rest breaks were offered.  ?  ?The battery of tests administered was selected by Dr. Christia Reading, Ph.D. with consideration to Deanna [name]'s current level of functioning, the nature of her symptoms, emotional and behavioral responses during interview, level of literacy, observed level of motivation/effort, and the nature of the referral question. This battery was communicated to the psychometrist. Communication between Dr. Christia Reading, Ph.D. and the psychometrist was ongoing throughout the evaluation and Dr. Christia Reading, Ph.D. was immediately accessible at all times. Dr. Christia Reading, Ph.D. provided supervision to the psychometrist on the date of this service to the extent necessary to assure the quality of all services provided.  ?  ?Deanna [name] will return within approximately 1-2 weeks for an interactive feedback session with Dr. Melvyn Novas at which time her test performances, clinical impressions, and treatment recommendations will be reviewed in detail. Deanna [name] understands she can contact our office should she require our assistance before this time. ? ?A total of 140 minutes of billable time were spent face-to-face with Deanna [name] by the psychometrist. This includes both test administration and scoring time. Billing for these services is reflected in the clinical report generated by Dr. Christia Reading, Ph.D. ? ?This note reflects time spent with the psychometrician and does not include test scores or any clinical interpretations made by Dr. Melvyn Novas. The full report will follow in a separate note.  ?

## 2022-01-14 NOTE — Progress Notes (Signed)
NEUROPSYCHOLOGICAL EVALUATION [place] Department of Neurology  Date of Evaluation: January 14, 2022  Reason for Referral:   Deanna [name] is a 71 y.o. right-handed Caucasian female referred by  Eilene Ghazi, NP , to characterize her current cognitive functioning and assist with diagnostic clarity and treatment planning in the context of subjective cognitive decline and a family history of Alzheimer's disease.   Assessment and Plan:   Clinical Impression(s): Deanna [name] pattern of performance is suggestive of primary impairments surrounding executive functioning, receptive and expressive language, and verbal aspects of learning and memory. A further weakness was exhibited across attention/concentration, while variability was exhibited across processing speed and retention aspects of memory. Performances were appropriate relative to age-matched peers across safety and judgment, visuospatial abilities, and most aspects of visual learning and memory. Deanna [name] generally denied difficulties completing instrumental activities of daily living (ADLs) independently. Her husband who was present agreed with this assessment. As such, given evidence for cognitive dysfunction described above, she meets criteria for a Mild Neurocognitive Disorder ("mild cognitive impairment").  The etiology for ongoing impairment is unclear at the present time and efforts to provide further clarity are hampered by the lack of recent neuroimaging. I cannot rule out an early frontotemporal dementia presentation, particularly a language variant (i.e., something in the primary progressive aphasia [PPA] family). During testing, Deanna [name] described her primary area of outward concern/difficulty surrounding expressive speech and stuttering behaviors (i.e., "I feel like I'm losing my speech and stuttering things I know but can't get out verbally"). Cognitive testing revealed consistent impairments  in receptive and expressive language. Additional impairment in executive functioning would also align with frontotemporal dysfunction; as would her relatively young age. However, symptoms do not fully fit with any of the three primary PPA subtypes at this time and I do not have neuroimaging to assess patterns of atrophy or hypometabolism. This condition in early stages remains plausible at the present time.  I also cannot rule out a very early Alzheimer's disease presentation. She did display fairly prominent verbal memory dysfunction, coupled with impairments in semantic fluency, confrontation naming, and executive functioning. However, despite being amnestic on a list learning task, other verbal retention rates ranged from 71% to 76%, which is encouraging. There is a positive family history to further consider with this etiology. It is also worth pointing out that the logopenic PPA subtype commonly shares pathology with Alzheimer's disease, increasing the potential for a mixed presentation. As such, this also remains plausible at the present time.  Behavioral patterns are not consistent with the behavioral variant of frontotemporal dementia. Behavioral and cognitive patterns are also not consistent with Lewy body dementia, Parkinson's disease, or a more rare parkinsonian presentation. I do not have recent neuroimaging to understand any potential vascular contributions or other anatomical abnormalities which could impact functioning. Continued medical monitoring will be important moving forward.   Recommendations: I would recommend that Deanna [name] be referred for neuroimaging in the form of a brain MRI. It will be particularly important to look for age-advanced atrophy in the frontal and temporal lobes given concerns expressed above. Depending on the results of this scan, an FDG-PET scan could also be beneficial is further diagnostic clarity is desired and a neurodegenerative process suspected. It should be  highlighted that no scan can diagnose a neurodegenerative illness on its own. Rather, these scans provide additional information which is combined with other known variables and risk factors.   Topamax/topiramate has well known cognitive  side effects and it is unclear how much this medication could be contributing (or not contributing) to her current clinical presentation. It may be that the benefits of this medication outweigh likely mild side effects. If desired, she could speak with Dr. Tomi Likens about possible alternatives.  A repeat neuropsychological evaluation in 18 months (or sooner if functional decline is noted) is recommended to assess the trajectory of future cognitive decline should it occur. This will also aid in future efforts towards improved diagnostic clarity.  Deanna [name] reported ongoing snoring behaviors and her husband reported that he has witnessed her stop breathing while asleep on several occasions, which would raise concerns for sleep apnea. If desired, she could speak with her PCP regarding a potential laboratory sleep study.   Should there be a progression current deficits over time, Deanna [name] is unlikely to regain any independent living skills lost. Therefore, it is recommended that she remain as involved as possible in all aspects of household chores, finances, and medication management, with supervision to ensure adequate performance. She will likely benefit from the establishment and maintenance of a routine in order to maximize her functional abilities over time.  It will be important for Deanna [name] to have another person with her when in situations where she may need to process information, weigh the pros and cons of different options, and make decisions, in order to ensure that she fully understands and recalls all information to be considered.  Performance across neurocognitive testing is not a strong predictor of an individual's safety operating a motor vehicle. Should  her family wish to pursue a formalized driving evaluation, they could reach out to the following agencies: [employer] in [place]: [phone] Driver Rehabilitative Services: [facility]: [place]: [phone] or [phone]  Ms. Googe is encouraged to attend to lifestyle factors for brain health (e.g., regular physical exercise, good nutrition habits, regular participation in cognitively-stimulating activities, and general stress management techniques), which are likely to have benefits for both emotional adjustment and cognition. Optimal control of vascular risk factors (including safe cardiovascular exercise and adherence to dietary recommendations) is encouraged. Continued participation in activities which provide mental stimulation and social interaction is also recommended.   Memory can be improved using internal strategies such as rehearsal, repetition, chunking, mnemonics, association, and imagery. External strategies such as written notes in a consistently used memory journal, visual and nonverbal auditory cues such as a calendar on the refrigerator or appointments with alarm, such as on a cell phone, can also help maximize recall.    Because she shows better recall for structured information, she will likely understand and retain new information better if it is presented to her in a meaningful or well-organized manner at the outset, such as grouping items into meaningful categories or presenting information in an outlined, bulleted, or story format.   To address problems with processing speed, she may wish to consider:   -Ensuring that she is alerted when essential material or instructions are being presented   -Adjusting the speed at which new information is presented   -Allowing for more time in comprehending, processing, and responding in conversation  To address problems with fluctuating attention and executive  dysfunction, she may wish to consider:   -Avoiding external distractions when needing to concentrate   -Limiting exposure to fast paced environments with multiple sensory demands   -Writing down complicated information and using checklists   -Attempting and completing one task at a time (i.e., no multi-tasking)   -  Verbalizing aloud each step of a task to maintain focus   -Reducing the amount of information considered at one time  Review of Records:   Ms. Windsor has been followed by [facility] Metta Clines, D.O.) in the past for management of migraine headaches. She most recently saw Dr. Tomi Likens on 04/10/2021 for medication adjustments. I was unable to find any mention of cognitive concerns or decline from this visit. Medications for headache management were notable for Topamax/topiramate which has known cognitive side effects.   Ms. Brandi was seen by her PCP Eilene Ghazi, NP) on 08/09/2021 for follow-up. During that appointment, Ms. Bambach reported friends informing her that she has seemed more forgetful. She also reported forgetting "small things" in her daily life and that there is a family history of Alzheimer's disease in her mother and brother. Ms. Clair Gulling noted that Ms. Bartl exhibited "minimal cognitive impairment" and "no evidence of true dementia." Ms. Hurtado reportedly expressed a desire for a more formal comprehension at that time. Ultimately, Ms. Erb was referred for a comprehensive neuropsychological evaluation to characterize her cognitive abilities and to assist with diagnostic clarity and treatment planning.   No neuroimaging was available for review.   Past Medical History:  Diagnosis Date   Decreased estrogen level    Hypertension    Migraine headache    Mixed hyperlipidemia    Osteoporosis    Personal history of colonic polyps    Varicose veins of bilateral lower extremities with other complications     Past Surgical History:  Procedure Laterality Date   BREAST  BIOPSY Left    WISDOM TOOTH EXTRACTION      Current Outpatient Medications:    Multiple Vitamin (MULTIVITAMIN) tablet, Take 1 tablet by mouth daily., Disp: , Rfl:    nortriptyline (PAMELOR) 25 MG capsule, TAKE 2 CAPSULES BY MOUTH AT BEDTIME, Disp: 180 capsule, Rfl: 3   rizatriptan (MAXALT) 10 MG tablet, Take 10 mg by mouth as needed for migraine. May repeat in 2 hours if needed, Disp: , Rfl:    topiramate (TOPAMAX) 200 MG tablet, TAKE 1 TABLET BY MOUTH EVERY DAY, Disp: 90 tablet, Rfl: 0  Clinical Interview:   The following information was obtained during a clinical interview with Ms. Zuk and her husband prior to cognitive testing.  Cognitive Symptoms: Decreased short-term memory: Endorsed. Primary examples included losing her train of thought, trouble recalling names of familiar individuals, word finding, and trouble recalling more fine details of past conversations. Her husband stated that concerns have been present for the past 6-12 months and seemed to be gradually worsening over time.  Decreased long-term memory: Denied. Decreased attention/concentration: Denied. Her husband described examples where she may become hyperfocused on something (e.g., the television) and lose some awareness of her surroundings. He also noted that when this occurs, she can stop a project she was in the middle of and might not return to complete it.  Reduced processing speed: Endorsed. Difficulties with executive functions: Endorsed. She acknowledged some trouble with indecision. Her husband was in agreement with this. They denied trouble with impulsivity or any significant personality changes.  Difficulties with emotion regulation: Denied. Difficulties with receptive language: Denied. Difficulties with word finding: Endorsed. Decreased visuoperceptual ability: Denied.  Difficulties completing ADLs: Largely denied. However, they both did describe occasional instances where she may forget to take her  medications or benefit from her husband providing her a reminder.   Additional Medical History: History of traumatic brain injury/concussion: Denied. History of stroke: Denied. History of  seizure activity: Denied. History of known exposure to toxins: Denied. Symptoms of chronic pain: Denied. Experience of frequent headaches/migraines: Endorsed. However, symptoms have significantly abated since her past visit with Dr. Tomi Likens in June 2022. Currently, headaches are generally caused by weather changes or stress. She has reportedly continued Topamax/topiramate use to treat headache symptoms. Frequent instances of dizziness/vertigo: Denied.  Sensory changes: She wears glasses with benefit and reported that food does not seem to taste as good as it used to. Other sensory changes/difficulties (e.g., hearing or smell) were denied.  Balance/coordination difficulties: Denied. She denied any recent falls.  Other motor difficulties: She described sporadic shakiness in her right hand, generally when performing some sort of fine motor action (e.g., holding a cup or newspaper).  Sleep History: Estimated hours obtained each night: 8 hours.  Difficulties falling asleep: Denied. Difficulties staying asleep: Denied. Feels rested and refreshed upon awakening: Denied. Despite getting an adequate amount of sleep, she reported generally having a hard time waking up in the morning and experiencing some fatigue.   History of snoring: Endorsed. History of waking up gasping for air: Denied. Witnessed breath cessation while asleep: Endorsed. Her husband reported that he has witnessed this on several occasions. I was unable to locate any past records to suggest Ms. Gossard having a sleep study to assess for sleep apnea.   History of vivid dreaming: Endorsed. Excessive movement while asleep: Denied. Instances of acting out her dreams: Denied. However, she did describe some occasional instances where she may reach out her  hands/arms while asleep depending on dream content. Her husband noted that she has become more talkative; however, he denied any hitting or kicking behaviors.   Psychiatric/Behavioral Health History: Depression: She described her current mood as "okay" and denied to her knowledge any prior mental health concerns or diagnoses. Current or remote suicidal ideation, intent, or plan was denied.  Anxiety: Denied. Mania: Denied. Trauma History: Denied. Visual/auditory hallucinations: Denied. Delusional thoughts: Denied.  Tobacco: Endorsed. Her husband estimated that she may consume 10 cigarettes per day.  Alcohol: She reported very minimal alcohol use and denied a history of problematic alcohol abuse or dependence.  Recreational drugs: Denied.  Family History: Problem Relation Age of Onset   Dementia Mother    Alzheimer's disease Mother    Heart attack Father 18   Alzheimer's disease Brother        onset in early 35s   Benign prostatic hyperplasia Brother    CVA Maternal Aunt    Dementia Maternal Uncle    This information was confirmed by Ms. Shuffield.  Academic/Vocational History: Highest level of educational attainment: 12 years. She graduated from high school and described herself as an average (B/C) student in academic settings. Math was noted as a likely relative weakness.  History of developmental delay: Denied. History of grade repetition: Denied. Enrollment in special education courses: Denied. History of LD/ADHD: Denied.  Employment: Retired. She previously worked as a Surveyor, mining.   Evaluation Results:   Behavioral Observations: Ms. Edberg was accompanied by her husband, arrived to her appointment on time, and was appropriately dressed and groomed. She appeared alert and oriented. Observed gait and station were within normal limits. Gross motor functioning appeared intact upon informal observation and no abnormal movements (e.g., tremors) were noted. Her affect was  generally relaxed and positive. Spontaneous speech was fluent and word finding difficulties were not observed during the clinical interview. Thought processes were coherent, organized, and normal in content. Insight into her cognitive difficulties appeared  adequate.   During testing, sustained attention was appropriate. Task engagement was adequate and she persisted when challenged. There were instances where Ms. Burdick had to be prompted by the psychometrist in order to provide a response. She also commonly required instruction repetition and clarification across more complex tasks (TMT B, D-KEFS Color Word, Similarities). Overall, Ms. Justiniano was cooperative with the clinical interview and subsequent testing procedures.   Adequacy of Effort: The validity of neuropsychological testing is limited by the extent to which the individual being tested may be assumed to have exerted adequate effort during testing. Ms. Spraker expressed her intention to perform to the best of her abilities and exhibited adequate task engagement and persistence. Scores across stand-alone and embedded performance validity measures were variable. However, her below expectation performance is believed to be due to true cognitive impairment as opposed to poor engagement or attempts to perform poorly. As such, the results of the current evaluation are believed to be a generally valid representation of Ms. Antony's current cognitive functioning.  Test Results: Ms. Glanz was fully oriented at the time of the current evaluation. When asked about the reason for the current evaluation, she responded "I feel like I'm losing my speech and stuttering things I know but can't get out verbally."   Intellectual abilities based upon educational and vocational attainment were estimated to be in the average range. Premorbid abilities were estimated to be within the average range based upon a single-word reading test.   Processing speed was variable,  ranging from the exceptionally low to average normative ranges. Basic attention was well below average. More complex attention (e.g., working memory) was also well below average. Executive functioning was generally exceptionally low relative to age-matched peers. However, she did perform in the above average range on a task assessing safety and judgment.   Assessed receptive language abilities were well below average. She exhibited difficulties across this task understanding conceptual information (e.g., point to the red square), understanding more complex sentence structure, and following multi-step commands. She was generally able to comprehending task instructions and answered all questions asked of her appropriately during interview. Assessed expressive language (e.g., verbal fluency and confrontation naming) was exceptionally low to well below average.     Assessed visuospatial/visuoconstructional abilities were average to exceptionally high.    Learning (i.e., encoding) of novel information was average on a shape learning task but well below average across three verbal tasks. Spontaneous delayed recall (i.e., retrieval) of previously learned information was commensurate with performance across learning trials. Retention rates were 76% across a story learning task, 0% across a list learning task, and 117% across a shape learning task. Performance across recognition tasks was average across a daily living task but well below average across two other tasks, suggesting limited evidence for information consolidation.   Results of emotional screening instruments suggested that recent symptoms of generalized anxiety were in the minimal range, while symptoms of depression were within normal limits. A screening instrument assessing recent sleep quality suggested the presence of minimal sleep dysfunction.  Tables of Scores:   Note: This summary of test scores accompanies the interpretive report and should  not be considered in isolation without reference to the appropriate sections in the text. Descriptors are based on appropriate normative data and may be adjusted based on clinical judgment. Terms such as "Within Normal Limits" and "Outside Normal Limits" are used when a more specific description of the test score cannot be determined.       Percentile - Normative  Descriptor > 98 - Exceptionally High 91-97 - Well Above Average 75-90 - Above Average 25-74 - Average 9-24 - Below Average 2-8 - Well Below Average < 2 - Exceptionally Low       Validity:   DESCRIPTOR       Dot Counting Test: --- --- Within Normal Limits  NAB EVI: --- --- Outside Normal Limits       Orientation:      Raw Score Percentile   NAB Orientation, Form 1 29/29 --- ---       Cognitive Screening:      Raw Score Percentile   SLUMS: 21/30 --- ---       Intellectual Functioning:      Standard Score Percentile   Barona Formula Estimated Premorbid IQ: 102 55 Average        Standard Score Percentile   Test of Premorbid Functioning: 91 27 Average       Memory:     NAB Memory Module, Form 1: Standard Score/ T Score Percentile   Total Memory Index 73 4 Well Below Average  List Learning       Total Trials 1-3 13/36 (33) 5 Well Below Average    List B 3/12 (44) 27 Average    Short Delay Free Recall 0/12 (21) <1 Exceptionally Low    Long Delay Free Recall 0/12 (28) 2 Well Below Average    Retention Percentage 0 (15) <1 Exceptionally Low    Recognition Discriminability 0 (28) 2 Well Below Average  Shape Learning       Total Trials 1-3 15/27 (51) 54 Average    Delayed Recall 7/9 (61) 86 Above Average    Retention Percentage 117 (56) 73 Average    Recognition Discriminability 3 (32) 4 Well Below Average  Story Learning       Immediate Recall 33/80 (29) 2 Well Below Average    Delayed Recall 13/40 (32) 4 Well Below Average    Retention Percentage 76 (44) 27 Average  Daily Living Memory       Immediate Recall 28/51  (34) 5 Well Below Average    Delayed Recall 10/17 (38) 12 Below Average    Retention Percentage 71 (39) 14 Below Average    Recognition Hits 9/10 (52) 58 Average       Attention/Executive Function:     Trail Making Test (TMT): Raw Score (T Score) Percentile     Part A 37 secs.,  0 errors (49) 46 Average    Part B 140 secs.,  0 errors (38) 12 Below Average         Scaled Score Percentile   WAIS-IV Coding: 5 5 Well Below Average       NAB Attention Module, Form 1: T Score Percentile     Digits Forward 32 4 Well Below Average    Digits Backwards 32 4 Well Below Average        Scaled Score Percentile   WAIS-IV Similarities: 1 <1 Exceptionally Low       D-KEFS Color-Word Interference Test: Raw Score (Scaled Score) Percentile     Color Naming 43 secs. (6) 9 Below Average    Word Reading 45 secs. (1) <1 Exceptionally Low    Inhibition 177 secs. (1) <1 Exceptionally Low      Total Errors 16 errors (1) <1 Exceptionally Low    Inhibition/Switching Discontinued --- Impaired      Total Errors --- --- ---       D-KEFS Verbal Fluency Test:  Raw Score (Scaled Score) Percentile     Letter Total Correct 16 (4) 2 Well Below Average    Category Total Correct 17 (3) 1 Exceptionally Low    Category Switching Total Correct 2 (1) <1 Exceptionally Low    Category Switching Accuracy 0 (1) <1 Exceptionally Low      Total Set Loss Errors 2 (10) 50 Average      Total Repetition Errors 1 (12) 75 Above Average       NAB Executive Functions Module, Form 1: T Score Percentile     Judgment 58 79 Above Average       Language:     Verbal Fluency Test: Raw Score (T Score) Percentile     Phonemic Fluency (FAS) 16 (28) 2 Well Below Average    Animal Fluency 10 (27) 1 Exceptionally Low        NAB Language Module, Form 1: T Score Percentile     Auditory Comprehension 31 3 Well Below Average    Naming 27/31 (36) 8 Well Below Average       Visuospatial/Visuoconstruction:      Raw Score Percentile    Clock Drawing: 9/10 --- Within Normal Limits       NAB Spatial Module, Form 1: T Score Percentile     Figure Drawing Copy 76 >99 Exceptionally High        Scaled Score Percentile   WAIS-IV Block Design: 10 50 Average       Mood and Personality:      Raw Score Percentile   Beck Depression Inventory - II: 0 --- Within Normal Limits  PROMIS Anxiety Questionnaire: 9 --- None to Slight       Additional Questionnaires:      Raw Score Percentile   PROMIS Sleep Disturbance Questionnaire: 20 --- None to Slight   Informed Consent and Coding/Compliance:   The current evaluation represents a clinical evaluation for the purposes previously outlined by the referral source and is in no way reflective of a forensic evaluation.   Ms. Mossberg was provided with a verbal description of the nature and purpose of the present neuropsychological evaluation. Also reviewed were the foreseeable risks and/or discomforts and benefits of the procedure, limits of confidentiality, and mandatory reporting requirements of this provider. The patient was given the opportunity to ask questions and receive answers about the evaluation. Oral consent to participate was provided by the patient.   This evaluation was conducted by Christia Reading, Ph.D., ABPP-CN, board certified clinical neuropsychologist. Ms. Debruyn completed a clinical interview with Dr. Melvyn Novas, billed as one unit [phone], and 140 minutes of cognitive testing and scoring, billed as one unit [phone] and four additional units [id number]. Psychometrist Milana Kidney, B.S., assisted Dr. Melvyn Novas with test administration and scoring procedures. As a separate and discrete service, Dr. Melvyn Novas spent a total of 160 minutes in interpretation and report writing billed as one unit [phone] and two units [id number].

## 2022-01-15 ENCOUNTER — Encounter: Payer: Self-pay | Admitting: Psychology

## 2022-01-29 ENCOUNTER — Ambulatory Visit (INDEPENDENT_AMBULATORY_CARE_PROVIDER_SITE_OTHER): Payer: PPO | Admitting: Psychology

## 2022-01-29 ENCOUNTER — Other Ambulatory Visit: Payer: Self-pay

## 2022-01-29 DIAGNOSIS — G3184 Mild cognitive impairment, so stated: Secondary | ICD-10-CM | POA: Diagnosis not present

## 2022-01-29 NOTE — Progress Notes (Signed)
? ?  Neuropsychology Feedback Session ?[place] ?[place] Department of Neurology ? ?Reason for Referral:  ? ?Deanna [name] is a 71 y.o. right-handed Caucasian female referred by  Eilene Ghazi, NP , to characterize her current cognitive functioning and assist with diagnostic clarity and treatment planning in the context of subjective cognitive decline and a family history of Alzheimer's disease.  ? ?Feedback:  ? ?Deanna [name] completed a comprehensive neuropsychological evaluation on 01/14/2022. Please refer to that encounter for the full report and recommendations. Briefly, results suggested primary impairments surrounding executive functioning, receptive and expressive language, and verbal aspects of learning and memory. A further weakness was exhibited across attention/concentration, while variability was exhibited across processing speed and retention aspects of memory. The etiology for ongoing impairment is unclear at the present time and efforts to provide further clarity are hampered by the lack of recent neuroimaging. I cannot rule out an early frontotemporal dementia presentation, particularly a language variant (i.e., something in the primary progressive aphasia [PPA] family). During testing, Deanna [name] described her primary area of outward concern/difficulty surrounding expressive speech and stuttering behaviors (i.e., "I feel like I'm losing my speech and stuttering things I know but can't get out verbally"). Cognitive testing revealed consistent impairments in receptive and expressive language. Additional impairment in executive functioning would also align with frontotemporal dysfunction; as would her relatively young age. However, symptoms do not fully fit with any of the three primary PPA subtypes at this time and I do not have neuroimaging to assess patterns of atrophy or hypometabolism. I also cannot rule out a very early Alzheimer's disease presentation. She did display  fairly prominent verbal memory dysfunction, coupled with impairments in semantic fluency, confrontation naming, and executive functioning. However, despite being amnestic on a list learning task, other verbal retention rates ranged from 71% to 76%, which is encouraging. There is a positive family history to further consider with this etiology. It is also worth pointing out that the logopenic PPA subtype commonly shares pathology with Alzheimer's disease, increasing the potential for a mixed presentation. As such, this also remains plausible at the present time. ? ?Deanna [name] was accompanied by her husband during the current feedback appointment. Content of the current session focused on the results of her neuropsychological evaluation. Deanna [name] was given the opportunity to ask questions and her questions were answered. She was encouraged to reach out should additional questions arise. A copy of her report was provided at the conclusion of the visit.  ? ?  ? ?25 minutes were spent conducting the current feedback session with Deanna [name], billed as one unit [phone].  ?

## 2022-01-31 ENCOUNTER — Other Ambulatory Visit: Payer: Self-pay | Admitting: Neurology

## 2022-02-05 DIAGNOSIS — N189 Chronic kidney disease, unspecified: Secondary | ICD-10-CM | POA: Diagnosis not present

## 2022-02-05 DIAGNOSIS — G43009 Migraine without aura, not intractable, without status migrainosus: Secondary | ICD-10-CM | POA: Diagnosis not present

## 2022-02-05 DIAGNOSIS — I10 Essential (primary) hypertension: Secondary | ICD-10-CM | POA: Diagnosis not present

## 2022-02-05 DIAGNOSIS — M810 Age-related osteoporosis without current pathological fracture: Secondary | ICD-10-CM | POA: Diagnosis not present

## 2022-02-05 DIAGNOSIS — E782 Mixed hyperlipidemia: Secondary | ICD-10-CM | POA: Diagnosis not present

## 2022-04-13 NOTE — Progress Notes (Unsigned)
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   1.  MRI of brain without contrast and check B12 and TSH. 2.  Migraine prevention:  As topiramate may affect cognition, would taper off topiramate.  Continue nortriptyline '50mg'$  at bedtime.  If migraines worsen, we can titrate up on nortriptyline to '75mg'$  at bedtime 2.  Migraine rescue:  Excedrin for low-grade headache, rizatriptan '10mg'$  for severe headache - she uses sparingly.  We discussed discontinuing triptans due to her age but she says she rarely takes it and would like to continue for now. 3.  Limit use of pain relievers to no more than 2 days out of week to prevent risk of rebound or medication-overuse headache. 4.  Keep headache diary 5.  Follow up with PCP regarding blood pressure 6.  Follow up in 5 months.   Subjective:  Deanna [name] is a 71 year old right-handed female with mitral valve prolapse who follows up for migraines.   UPDATE: Intensity:  Moderate to severe Duration:  2 hours Frequency:  2 migraines in past month (1 mild and 1 severe)  Her family and friends were concerned about her memory problems for the past year.  Problems with recall and word finding difficulty.  Concern because her mother had Alzheimer's disease and her brother has Alzheimer's disease.  Underwent neuropsychological evaluation on 01/14/2022 which revealed impairment involving executive functioning, receptive and expressive language, and verbal aspects of learning and memory meeting criteria of mild neurocognitive disorder but of unclear etiology but a language variant FTD was considered.    Rescue protocol:  Excedrin for low-grade headache.   Rizatriptan for severe headache. Current NSAIDS: No Current analgesics: Excedrin Current triptans: Rizatriptan '10mg'$  Current ergotamine: None Current anti-emetic: None Current muscle relaxants: tizanidine '2mg'$  (for neck soreness) Current anti-anxiolytic: None Current sleep aide: None Current Antihypertensive medications: None Current Antidepressant medications: Nortriptyline 50 mg Current Anticonvulsant medications: Topiramate 200 mg Current anti-CGRP: none Current Vitamins/Herbal/Supplements: Multivitamin Current Antihistamines/Decongestants: None Other therapy: None Hormone/birth control: none   Caffeine: Decaf coffee only Alcohol: No Smoker: 5 to 6 cigarettes a day Diet: Does not skip meals.  Decaf coffee and decaf Pepsi.  Needs to increase water intake.  She has been eating more and has gained weight (now about 90 lbs). 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 Depression: No; Anxiety: Yes.  Caring for her ill mother-in-law. Other pain: No Sleep hygiene: Good   HISTORY: Onset:  Age 50 Location:  right temporal/periorbital Quality:  Non-throbbing Initial intensity:  Mild to moderate.  She denies new headache, thunderclap headache or severe headache that wakes her from sleep. Aura:  no Prodrome:  no Postdrome:  no Associated symptoms: Photophobia, phonophobia, dizziness, infrequently nausea.  She denies associated vomiting, visual disturbance, autonomic symptoms, osmophobia or unilateral numbness or weakness. Initial duration:  1 to 2 hours if takes medication at earliest onset Initial Frequency:  8 days a month (4 mild, 3 moderate, 1 severe) Initial Frequency of abortive medication: as needed Triggers:  Allergies, skipping meals, sunlight/glare, change in weather, stress, certain scents Relieving factors:  Excedrin or Zomig Activity:  Severe aggravates   Past NSAIDS:  Aleve, ibuprofen Past analgesics:  acetaminophen, BC powder, Goody powder, Excedrin Past abortive  triptans:  sumatriptan tablet, Maxalt, Relpax, Zomig NS (not covered by insurance) Past ergotamines:  No Past muscle relaxants:  Flexeril, Robaxin, Baclofen, Skelaxin Past anti-emetic:  no Past antihypertensive medications:  no Past antidepressant medications:  imipramine Past anticonvulsant medications:  topiramate Past anti-CGRP: no Past vitamins/Herbal/Supplements:  no Past antihistamines/decongestants:  Allegra, Benadryl, Clarinex, Claritin, Dramamine, Flonase, pseudoephedrine Other past therapies:  trigger point injections   Family history of headache:  Paternal grandmother    PAST MEDICAL HISTORY: Past Medical History:  Diagnosis Date   Decreased estrogen level    Hypertension    Migraine headache    Mild cognitive impairment of uncertain or unknown etiology 01/14/2022   Mixed hyperlipidemia    Osteoporosis    Personal history of colonic polyps    Varicose veins of bilateral lower extremities with other complications     MEDICATIONS: Current Outpatient Medications on File Prior to Visit  Medication Sig Dispense Refill   alendronate (FOSAMAX) 70 MG tablet 1 tablet 30 minutes before the first food, beverage or medicine of the day with plain water     amoxicillin (AMOXIL) 500 MG capsule Take 1,000 mg by mouth 2 (two) times daily.     atorvastatin (LIPITOR) 10 MG tablet TAKE 1 TABLET BY MOUTH EVERY DAY FOR 30 DAYS     lisinopril (ZESTRIL) 20 MG tablet Take 20 mg by mouth daily.     Multiple Vitamin (MULTIVITAMIN) tablet Take 1 tablet by mouth daily.     nortriptyline (PAMELOR) 25 MG capsule TAKE 2 CAPSULES BY MOUTH AT BEDTIME 180 capsule 3   rizatriptan (MAXALT) 10 MG tablet Take 10 mg by mouth as needed for migraine. May repeat in 2 hours if needed     topiramate (TOPAMAX) 200 MG tablet TAKE 1 TABLET BY MOUTH EVERY DAY 90 tablet 0   No current facility-administered medications on file prior to visit.    ALLERGIES: Allergies  Allergen Reactions   Sulfa Antibiotics      FAMILY HISTORY: Family History  Problem Relation Age of Onset   Dementia Mother    Alzheimer's disease Mother    Heart attack Father 23   Alzheimer's disease Brother        onset in early 43s   Benign prostatic hyperplasia Brother    CVA Maternal Aunt    Dementia Maternal Uncle       Objective:  Blood pressure (!) 194/92, pulse 86, height '5\' 3"'$  (1.6 m), weight 88 lb 3.2 oz (40 kg), SpO2 100 %. General: No acute distress.  Patient appears well-groomed.   Head:  Normocephalic/atraumatic Eyes:  Fundi examined but not visualized Neck: supple, no paraspinal tenderness, full range of motion Heart:  Regular rate and rhythm Lungs:  Clear to auscultation bilaterally Back: No paraspinal tenderness Neurological Exam: alert and oriented to person, place, and time.  Speech fluent and not dysarthric, language intact.      04/14/2022    1:00 PM  [place] Exam  Weekday Correct 1  Current year 1  What state are we in? 1  Amount spent 0  Amount left 0  # of Animals 1  5 objects recall 2  Number series 1  Hour markers 0  Time correct 0  Placed X in triangle correctly 1  Largest Figure 1  Name of female 2  Date back to work 0  Type of work 2  State she lived in 0  Total score 13   CN II-XII intact. Bulk and tone normal, muscle strength 5/5  throughout.  Sensation to light touch intact.  Deep tendon reflexes 2+ throughout.  Finger to nose testing intact.  Gait normal, Romberg negative.   Metta Clines, DO

## 2022-04-14 ENCOUNTER — Encounter: Payer: Self-pay | Admitting: Neurology

## 2022-04-14 ENCOUNTER — Ambulatory Visit: Payer: PPO | Admitting: Neurology

## 2022-04-14 ENCOUNTER — Other Ambulatory Visit (INDEPENDENT_AMBULATORY_CARE_PROVIDER_SITE_OTHER): Payer: PPO

## 2022-04-14 VITALS — BP 194/92 | HR 86 | Ht 63.0 in | Wt 88.2 lb

## 2022-04-14 DIAGNOSIS — G43009 Migraine without aura, not intractable, without status migrainosus: Secondary | ICD-10-CM

## 2022-04-14 DIAGNOSIS — I10 Essential (primary) hypertension: Secondary | ICD-10-CM | POA: Diagnosis not present

## 2022-04-14 DIAGNOSIS — G3184 Mild cognitive impairment, so stated: Secondary | ICD-10-CM | POA: Diagnosis not present

## 2022-04-14 LAB — TSH: TSH: 2.22 u[IU]/mL (ref 0.35–5.50)

## 2022-04-14 LAB — VITAMIN B12: Vitamin B-12: 857 pg/mL (ref 211–911)

## 2022-04-14 MED ORDER — TOPIRAMATE 50 MG PO TABS: ORAL_TABLET | ORAL | 0 refills | Status: DC

## 2022-04-14 NOTE — Patient Instructions (Addendum)
Continue nortriptyline '50mg'$  at bedtime We will get off of topiramate.  Stop the '200mg'$  pill.  Start '50mg'$  pill - take 2 pills daily for one week, then 1 pill daily for one week, then STOP Check MRI of brain Check B12 and TSH Further recommendations pending results.  Otherwise, follow up in 5 months.

## 2022-04-17 ENCOUNTER — Ambulatory Visit
Admission: RE | Admit: 2022-04-17 | Discharge: 2022-04-17 | Disposition: A | Discharge status: Home / Self Care | Payer: PPO | Source: Ambulatory Visit | Attending: Neurology | Admitting: Neurology

## 2022-04-17 DIAGNOSIS — G3184 Mild cognitive impairment, so stated: Secondary | ICD-10-CM | POA: Diagnosis not present

## 2022-04-17 DIAGNOSIS — R410 Disorientation, unspecified: Secondary | ICD-10-CM | POA: Diagnosis not present

## 2022-04-29 ENCOUNTER — Telehealth: Payer: Self-pay | Admitting: Neurology

## 2022-04-29 ENCOUNTER — Other Ambulatory Visit: Payer: Self-pay | Admitting: Neurology

## 2022-04-29 DIAGNOSIS — G3184 Mild cognitive impairment, so stated: Secondary | ICD-10-CM

## 2022-04-29 NOTE — Telephone Encounter (Signed)
Pt called in stating she has not heard about the results of her MRI she did on 04/17/22.

## 2022-04-30 NOTE — Telephone Encounter (Signed)
Messaged Shaune Pascal at [facility] will have someone look into it right a way.

## 2022-05-01 NOTE — Telephone Encounter (Signed)
PA Faxed over to [facility] [phone]

## 2022-05-01 NOTE — Telephone Encounter (Signed)
Patient advised of her MRI results. PET Scan ordered.

## 2022-05-08 NOTE — Telephone Encounter (Signed)
PA approved 05/01/22-07/30/22, Approval codes; [id number].  02-Jun-2022 at 11:30 am arrive by 11 am NPO 6 hours.   Tried calling patient not answer.

## 2022-05-12 NOTE — Telephone Encounter (Signed)
Patient advised of appt and NPO 6 hours before appt.

## 2022-05-19 ENCOUNTER — Ambulatory Visit (HOSPITAL_COMMUNITY)
Admission: RE | Admit: 2022-05-19 | Discharge: 2022-05-19 | Disposition: A | Discharge status: Home / Self Care | Payer: PPO | Source: Ambulatory Visit | Attending: Neurology | Admitting: Neurology

## 2022-05-19 DIAGNOSIS — G309 Alzheimer's disease, unspecified: Secondary | ICD-10-CM | POA: Diagnosis not present

## 2022-05-19 DIAGNOSIS — G3184 Mild cognitive impairment, so stated: Secondary | ICD-10-CM | POA: Insufficient documentation

## 2022-05-19 LAB — GLUCOSE, CAPILLARY: Glucose-Capillary: 115 mg/dL — ABNORMAL HIGH (ref 70–99)

## 2022-05-19 MED ORDER — FLUDEOXYGLUCOSE F - 18 (FDG) INJECTION
9.9300 | Freq: Once | INTRAVENOUS | Status: AC | PRN
  Administered 2022-05-19: 9.93 via INTRAVENOUS

## 2022-05-21 ENCOUNTER — Telehealth: Payer: Self-pay | Admitting: Neurology

## 2022-05-21 NOTE — Telephone Encounter (Signed)
PET scan of the brain was normal - no pattern consistent with FTD or Alzheimer's.  I called and spoke with Deanna [name]. Neuropsych testing showed dysfunction in language.  Some variable memory deficits but does not fit pattern of classic AD.  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  I explained we can pursue LP to test CSF for biomarkers for AD.  Otherwise, we would just monitor and re-evaluate later on for any changes.  If biomarkers are positive, I would likely start donepezil now.  Otherwise, I would not start a cholinesterase inhibitor now as she has MCI but etiology is unclear.  She will talk with her husband and get back to use about whether she would like to proceed with LP or wait and monitor for changes.

## 2022-05-26 ENCOUNTER — Telehealth: Payer: Self-pay | Admitting: Neurology

## 2022-05-26 DIAGNOSIS — G3184 Mild cognitive impairment, so stated: Secondary | ICD-10-CM

## 2022-05-26 NOTE — Telephone Encounter (Signed)
Per DR.Jaffe note 05/21/22, Patient PETscan results given. Recommend a LP. Patient to discuss it with her husband and call us back.   LP Order added

## 2022-05-26 NOTE — Telephone Encounter (Signed)
Pt called in and left a message with the access nurse. She stated her doctor told her she should have an epidural. She would like to speak with someone as she has some questions.

## 2022-05-27 NOTE — Telephone Encounter (Signed)
Patient called back

## 2022-05-27 NOTE — Telephone Encounter (Signed)
Patient advised of Dr.Jaffe note, We can potentially start a medication but I would not know what I am treating.  Patient states her LP is scheduled for Thursday.

## 2022-05-27 NOTE — Telephone Encounter (Signed)
Telephone call to patient, Patient and husband on the line. Question: Should she or will she get medication if she do not do the LP.

## 2022-05-27 NOTE — Telephone Encounter (Signed)
Tired calling patient no answer. [facility] to call patient to schedule.

## 2022-05-29 ENCOUNTER — Other Ambulatory Visit (HOSPITAL_COMMUNITY)
Admission: RE | Admit: 2022-05-29 | Discharge: 2022-05-29 | Disposition: A | Discharge status: Home / Self Care | Payer: PPO | Source: Ambulatory Visit | Attending: Neurology | Admitting: Neurology

## 2022-05-29 ENCOUNTER — Ambulatory Visit
Admission: RE | Admit: 2022-05-29 | Discharge: 2022-05-29 | Disposition: A | Discharge status: Home / Self Care | Payer: PPO | Source: Ambulatory Visit | Attending: Neurology | Admitting: Neurology

## 2022-05-29 DIAGNOSIS — G3184 Mild cognitive impairment, so stated: Secondary | ICD-10-CM | POA: Diagnosis not present

## 2022-05-29 NOTE — Progress Notes (Signed)
Blood drawn from pts RAC for labs to be sent off with LP lab work. 1 successful attempt. Pt tolerated well. Gauze and tape applied after.

## 2022-05-29 NOTE — Discharge Instructions (Signed)

## 2022-05-30 LAB — CYTOLOGY - NON PAP

## 2022-05-31 LAB — HERPES SIMPLEX VIRUS, TYPE 1 AND 2 DNA,QUAL,RT PCR
HSV 1 DNA: NOT DETECTED
HSV 2 DNA: NOT DETECTED

## 2022-06-02 ENCOUNTER — Telehealth: Payer: Self-pay

## 2022-06-03 NOTE — Telephone Encounter (Signed)
Deanna [name] from [facility] called after [facility] called about samples Deanna [name] stated she will call [facility] to let them know they are looking for samples and that they will send them in to them. [facility] stated that they need 0.26m more to complete the test

## 2022-06-05 ENCOUNTER — Other Ambulatory Visit: Payer: Self-pay | Admitting: Neurology

## 2022-06-06 NOTE — Telephone Encounter (Signed)
Seth Bake from the [facility] called in and left a message with the access nurse. She wants to verify if more testing is needed and what to do with the CSF?

## 2022-06-06 NOTE — Telephone Encounter (Signed)
[facility] no additional testing needed per Dr.Aqunio.

## 2022-06-09 ENCOUNTER — Other Ambulatory Visit: Payer: Self-pay

## 2022-06-09 LAB — MAYO MISC ORDER 2

## 2022-06-09 MED ORDER — DONEPEZIL HCL 5 MG PO TABS: 5.0000 mg | ORAL_TABLET | Freq: Every day | ORAL | 0 refills | Status: DC

## 2022-06-09 NOTE — Telephone Encounter (Signed)
Telephone call to patient, Results advised.  Patient agrees to Donepezil 5 mg.  Script sent to [facility] as requested.  Patient wanted to know if Dr.Jaffe could write a letter to get her out of [place] duty. Per patient she has to appear. The cut off year is 71 years old.

## 2022-06-09 NOTE — Telephone Encounter (Signed)
-----   Message from Pieter Partridge, DO sent at 06/09/2022  3:14 PM EDT ----- Labs from the spinal fluid show findings seen with Alzheimer's.  I would like to start a medication called donepezil, which may help slow progression of memory problems.  If patient agreeable, please send prescription for donepezil '5mg'$  at bedtime - 30 day supply with no refills. If she is doing well after a month, we will then increase dose to '10mg'$  at bedtime.

## 2022-06-10 LAB — MAYO MISCELANEOUS 3
Miscellaneous Test Results: NEGATIVE
NORMAL RANGE:: NEGATIVE
PRICE:: 333.1

## 2022-06-10 LAB — MAYO MISCELLANEOUS 2
Miscellaneous Test Results: NEGATIVE
NORMAL RANGE:: NEGATIVE
PRICE:: 333.1

## 2022-06-10 LAB — MAYO MISC ORDER: PRICE:: 1691.3

## 2022-06-10 LAB — MAYO MISCELANEOUS 4: PRICE:: 1695.1

## 2022-06-10 LAB — MAYO MISC ORDER 3: PRICE:: 1240

## 2022-06-12 ENCOUNTER — Encounter: Payer: Self-pay | Admitting: Neurology

## 2022-06-12 ENCOUNTER — Telehealth: Payer: Self-pay | Admitting: Neurology

## 2022-06-12 NOTE — Telephone Encounter (Signed)
Patient advised letter at the front desk.

## 2022-06-12 NOTE — Telephone Encounter (Signed)
Lab findings in the spinal fluid do suggest that the dementia is related to Alzheimer's.    I would like to start donepezil (Aricept).  Please send prescription for donepezil '5mg'$  daily for four weeks. After 4 weeks, contact us and we will increase the dose to '10mg'$  daily.  Side effects include nausea, vomiting, diarrhea, vivid dreams, and muscle cramps.  Please call the clinic if you experience any of these symptoms.

## 2022-06-12 NOTE — Telephone Encounter (Signed)
Patient advised.   Patient wanted to know if Dr.Jaffe will write her letter to get out of [place] duty.

## 2022-06-27 LAB — CRYPTOCOCCAL AG, LTX SCR RFLX TITER
Cryptococcal Ag Screen: NOT DETECTED
MICRO NUMBER:: 13672775
SPECIMEN QUALITY:: ADEQUATE

## 2022-06-27 LAB — PROTEIN, CSF 14-3-3 (PRION DISEASE)
14-3-3 PROTEIN (CSF)++: 11286 AU/ml — ABNORMAL HIGH (ref 30–1999)
RT-QUIC (CSF)*: NEGATIVE
T-TAU PROTEIN (CSF)++: 2511 pg/ml — ABNORMAL HIGH (ref 0–1149)

## 2022-06-27 LAB — CNS IGG SYNTHESIS RATE, CSF+BLOOD
Albumin Serum: 4.4 g/dL (ref 3.6–5.1)
Albumin, CSF: 17.3 mg/dL (ref 8.0–42.0)
CNS-IgG Synthesis Rate: -3.4 mg/24 h (ref <=3.3)
IgG (Immunoglobin G), Serum: 1020 mg/dL (ref 600–1540)
IgG Total CSF: 1.9 mg/dL (ref 0.8–7.7)
IgG-Index: 0.47 (ref <0.70)

## 2022-06-27 LAB — CSF CELL COUNT WITH DIFFERENTIAL
RBC Count, CSF: 1 cells/uL — ABNORMAL HIGH
TOTAL NUCLEATED CELL: 0 cells/uL (ref 0–5)

## 2022-06-27 LAB — FUNGUS CULTURE W SMEAR
CULTURE:: NO GROWTH
MICRO NUMBER:: 13672776
SMEAR:: NONE SEEN
SPECIMEN QUALITY:: ADEQUATE

## 2022-06-27 LAB — GLUCOSE, CSF: Glucose, CSF: 58 mg/dL (ref 40–80)

## 2022-06-27 LAB — OLIGOCLONAL BANDS, CSF + SERM: Oligo Bands: ABSENT

## 2022-06-27 LAB — CSF CULTURE W GRAM STAIN
GRAM STAIN:: NONE SEEN
MICRO NUMBER:: 13672777
Result:: NO GROWTH
SPECIMEN QUALITY:: ADEQUATE

## 2022-06-27 LAB — PROTEIN, CSF: Total Protein, CSF: 38 mg/dL (ref 15–60)

## 2022-06-27 LAB — TEST AUTHORIZATION

## 2022-06-27 LAB — VDRL, CSF: VDRL Quant, CSF: NONREACTIVE

## 2022-06-27 LAB — HERPES SIMPLEX VIRUS, TYPE 1 AND 2 DNA,QUAL,RT PCR
HSV 1 DNA: NOT DETECTED
HSV 2 DNA: NOT DETECTED

## 2022-07-01 ENCOUNTER — Other Ambulatory Visit: Payer: Self-pay | Admitting: Neurology

## 2022-07-26 ENCOUNTER — Other Ambulatory Visit: Payer: Self-pay | Admitting: Neurology

## 2022-08-07 DIAGNOSIS — Z23 Encounter for immunization: Secondary | ICD-10-CM | POA: Diagnosis not present

## 2022-08-07 DIAGNOSIS — N189 Chronic kidney disease, unspecified: Secondary | ICD-10-CM | POA: Diagnosis not present

## 2022-08-07 DIAGNOSIS — G3184 Mild cognitive impairment, so stated: Secondary | ICD-10-CM | POA: Diagnosis not present

## 2022-08-07 DIAGNOSIS — M810 Age-related osteoporosis without current pathological fracture: Secondary | ICD-10-CM | POA: Diagnosis not present

## 2022-08-07 DIAGNOSIS — I10 Essential (primary) hypertension: Secondary | ICD-10-CM | POA: Diagnosis not present

## 2022-08-07 DIAGNOSIS — Z7189 Other specified counseling: Secondary | ICD-10-CM | POA: Diagnosis not present

## 2022-08-07 DIAGNOSIS — Z1159 Encounter for screening for other viral diseases: Secondary | ICD-10-CM | POA: Diagnosis not present

## 2022-08-07 DIAGNOSIS — Z0000 Encounter for general adult medical examination without abnormal findings: Secondary | ICD-10-CM | POA: Diagnosis not present

## 2022-08-07 DIAGNOSIS — E782 Mixed hyperlipidemia: Secondary | ICD-10-CM | POA: Diagnosis not present

## 2022-08-19 ENCOUNTER — Other Ambulatory Visit: Payer: Self-pay | Admitting: Neurology

## 2022-08-25 ENCOUNTER — Other Ambulatory Visit: Payer: Self-pay | Admitting: Neurology

## 2022-08-27 ENCOUNTER — Other Ambulatory Visit: Payer: Self-pay | Admitting: Neurology

## 2022-08-28 ENCOUNTER — Other Ambulatory Visit: Payer: Self-pay | Admitting: Neurology

## 2022-09-10 ENCOUNTER — Other Ambulatory Visit: Payer: Self-pay | Admitting: Neurology

## 2022-09-11 NOTE — Progress Notes (Signed)
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  Dementia management:  donepezil '10mg'$  at bedtime.  Start memantine titrating to '10mg'$  twice daily. 2.  Migraine prevention:  nortriptyline '50mg'$  at bedtime. 3.  Migraine rescue:  Excedrin 4.  Limit use of pain relievers to no more than 2 days out of week to prevent risk of rebound or medication-overuse headache. 5.  Keep headache diary 6.  Follow up with PCP regarding blood pressure 7.  She is interested in seeing if she is a candidate for one of the Alzheimer's treatment studies at [facility] Will send referral. 8.  Follow up in 5 months.   Subjective:  Deanna [name] is a 71 year old right-handed female with mitral valve prolapse who follows up for migraines.   UPDATE: Underwent workup for dementia. Serum labs from 04/14/2021 revealed B12 857 and TSH 2.22.  MRI of brain without contrast on 04/30/2022 personally reviewed showed mild chronic small vessel ischemic changes but otherwise unremarkable.  PET scan of brain on 05/21/2022 was within normal limits.  Underwent LP on 05/29/2022 which revealed CSF elevated T-Tau protein of 2,511 and 14-3-3 11,286.  Paraneoplastic panel negative, cell count 0, protein 38, glucose 58, negative VDRL, negative hepatitis E IgG, negative HSV 1/2 DNA, negative IgG synthesis/index, negative oligoclonal bands, negative culture, negative fungal culture, negative cryptococcal antigen.  She was started on donepezil.  Having increased trouble with names and may stop in middle of conversation because she forgets what she wants to say.  She may rarely drive to just certain known places like her friend's home or the hair dresser.  Otherwise she doesn't drive.  She may not recognize familiar routes.  May not remember what she ate for dinner the previous night.     Tapered off topiramate due to cognitive side effects.   Intensity:  Mild to moderate Duration:  2 hours Frequency:  3 migraines in in October (1 mild and 1 severe) Rescue protocol:  Excedrin for low-grade headache.   Current NSAIDS: No Current analgesics: Excedrin Current triptans: Rizatriptan '10mg'$  Current ergotamine: None Current anti-emetic: None Current muscle relaxants: tizanidine '2mg'$  (for neck soreness) Current anti-anxiolytic: None Current sleep aide: None Current Antihypertensive medications: None Current Antidepressant medications: Nortriptyline 50 mg Current Anticonvulsant medications: none Current anti-CGRP: none Current Vitamins/Herbal/Supplements: Multivitamin Current Antihistamines/Decongestants: None Other therapy: None Hormone/birth control: none Other medications:  donepezil '10mg'$  at bedtime   Caffeine: Decaf coffee only Alcohol: No Smoker: 5 to 6 cigarettes a day Diet: Does not skip meals.  Decaf coffee and decaf Pepsi.  Needs to increase water intake.  She has been eating more and has gained weight (now about 90 lbs). 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 Depression: No; Anxiety: Yes.  Caring for her ill mother-in-law. Other pain: No Sleep hygiene: Good   HISTORY: Onset:  Age 41 Location:  right temporal/periorbital Quality:  Non-throbbing Initial intensity:  Mild to moderate.  She denies new headache, thunderclap headache or severe headache that wakes her from sleep. Aura:  no Prodrome:  no Postdrome:  no Associated symptoms: Photophobia, phonophobia, dizziness, infrequently nausea.  She denies associated vomiting, visual disturbance, autonomic symptoms, osmophobia or unilateral numbness or weakness. Initial duration:  1 to 2 hours if takes medication at earliest onset Initial Frequency:  8 days a month (4 mild, 3 moderate, 1 severe) Initial Frequency of  abortive medication: as needed Triggers:  Allergies, skipping meals, sunlight/glare, change  in weather, stress, certain scents Relieving factors:  Excedrin or Zomig Activity:  Severe aggravates  Her family and friends were concerned about her memory problems since 2022.  Problems with recall and word finding difficulty.  Concern because her mother had Alzheimer's disease and her brother has Alzheimer's disease.  Underwent neuropsychological evaluation on 01/14/2022 which revealed impairment involving executive functioning, receptive and expressive language, and verbal aspects of learning and memory meeting criteria of mild neurocognitive disorder but of unclear etiology but a language variant FTD was considered.     Past NSAIDS:  Aleve, ibuprofen Past analgesics:  acetaminophen, BC powder, Goody powder, Excedrin Past abortive triptans:  sumatriptan tablet, Maxalt, Relpax, Zomig NS (not covered by insurance) Past ergotamines:  No Past muscle relaxants:  Flexeril, Robaxin, Baclofen, Skelaxin Past anti-emetic:  no Past antihypertensive medications:  no Past antidepressant medications:  imipramine Past anticonvulsant medications:  topiramate Past anti-CGRP: no Past vitamins/Herbal/Supplements:  no Past antihistamines/decongestants:  Allegra, Benadryl, Clarinex, Claritin, Dramamine, Flonase, pseudoephedrine Other past therapies:  trigger point injections   Family history of headache:  Paternal grandmother    PAST MEDICAL HISTORY: Past Medical History:  Diagnosis Date   Decreased estrogen level    Hypertension    Migraine headache    Mild cognitive impairment of uncertain or unknown etiology 01/14/2022   Mixed hyperlipidemia    Osteoporosis    Personal history of colonic polyps    Varicose veins of bilateral lower extremities with other complications     MEDICATIONS: Current Outpatient Medications on File Prior to Visit  Medication Sig Dispense Refill   alendronate (FOSAMAX) 70 MG tablet 1 tablet 30 minutes before the first food, beverage or medicine of the day with plain water      amoxicillin (AMOXIL) 500 MG capsule Take 1,000 mg by mouth 2 (two) times daily.     atorvastatin (LIPITOR) 10 MG tablet TAKE 1 TABLET BY MOUTH EVERY DAY FOR 30 DAYS     lisinopril (ZESTRIL) 20 MG tablet Take 20 mg by mouth daily.     Multiple Vitamin (MULTIVITAMIN) tablet Take 1 tablet by mouth daily.     nortriptyline (PAMELOR) 25 MG capsule TAKE 2 CAPSULES BY MOUTH AT BEDTIME 180 capsule 3   rizatriptan (MAXALT) 10 MG tablet Take 10 mg by mouth as needed for migraine. May repeat in 2 hours if needed     topiramate (TOPAMAX) 200 MG tablet TAKE 1 TABLET BY MOUTH EVERY DAY 90 tablet 0   No current facility-administered medications on file prior to visit.    ALLERGIES: Allergies  Allergen Reactions   Sulfa Antibiotics     FAMILY HISTORY: Family History  Problem Relation Age of Onset   Dementia Mother    Alzheimer's disease Mother    Heart attack Father 31   Alzheimer's disease Brother        onset in early 81s   Benign prostatic hyperplasia Brother    CVA Maternal Aunt    Dementia Maternal Uncle       Objective:  Blood pressure (!) 153/85, pulse 90, height '5\' 3"'$  (1.6 m), weight 96 lb 3.2 oz (43.6 kg), SpO2 99 %. General: No acute distress.  Patient appears well-groomed.      Metta Clines, DO

## 2022-09-15 ENCOUNTER — Encounter: Payer: Self-pay | Admitting: Neurology

## 2022-09-15 ENCOUNTER — Ambulatory Visit: Payer: PPO | Admitting: Neurology

## 2022-09-15 VITALS — BP 153/85 | HR 90 | Ht 63.0 in | Wt 96.2 lb

## 2022-09-15 DIAGNOSIS — G43009 Migraine without aura, not intractable, without status migrainosus: Secondary | ICD-10-CM | POA: Diagnosis not present

## 2022-09-15 DIAGNOSIS — G309 Alzheimer's disease, unspecified: Secondary | ICD-10-CM

## 2022-09-15 DIAGNOSIS — F0670 Mild neurocognitive disorder due to known physiological condition without behavioral disturbance: Secondary | ICD-10-CM | POA: Diagnosis not present

## 2022-09-15 MED ORDER — DONEPEZIL HCL 10 MG PO TABS: 10.0000 mg | ORAL_TABLET | Freq: Every day | ORAL | 11 refills | Status: DC

## 2022-09-15 MED ORDER — NORTRIPTYLINE HCL 50 MG PO CAPS: 50.0000 mg | ORAL_CAPSULE | Freq: Every day | ORAL | 11 refills | Status: DC

## 2022-09-15 MED ORDER — MEMANTINE HCL 10 MG PO TABS
10.0000 mg | ORAL_TABLET | Freq: Two times a day (BID) | ORAL | 11 refills | Status: DC
Start: 2022-09-15 — End: 2022-10-08

## 2022-09-15 NOTE — Patient Instructions (Addendum)
Start memantine '10mg'$  tablet.  Written on bottle for 1 tablet twice daily, but I want you to slowly go up on dose as follows: Take 1/2 tablet at bedtime for one week Then 1/2 tablet twice daily for one week Then 1 tablet twice daily Continue donepezil '10mg'$  at bedtime Refer to [facility] for ongoing studies for treatment of Alzeimer's Continue nortriptyline '50mg'$  at bedtime - switch to taking one '50mg'$  capsule at bedtime Follow up 6 months.

## 2022-10-07 ENCOUNTER — Other Ambulatory Visit: Payer: Self-pay | Admitting: Neurology

## 2022-11-06 ENCOUNTER — Other Ambulatory Visit: Payer: Self-pay | Admitting: Neurology

## 2022-12-11 DIAGNOSIS — Z1231 Encounter for screening mammogram for malignant neoplasm of breast: Secondary | ICD-10-CM | POA: Diagnosis not present

## 2023-03-13 NOTE — Progress Notes (Unsigned)
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  Dementia management:  donepezil 10mg  at bedtime, memantine 10mg  twice daily.*** 2.  Migraine prevention:  nortriptyline 50mg  at bedtime. *** 3.  Migraine rescue:  Excedrin *** 4.  Limit use of pain relievers to no more than 2 days out of week to prevent risk of rebound or medication-overuse headache. 5.  Keep headache diary 6.  Follow up with PCP regarding blood pressure 7.  Follow up ***   Subjective:  Deanna [name] is a 72 year old right-handed female with mitral valve prolapse who follows up for migraines.   UPDATE: On donepezil 10mg  at bedtime and memantine 10mg  twice daily.  Having increased trouble with names and may stop in middle of conversation because she forgets what she wants to say.  She may rarely drive to just certain known places like her friend's home or the hair dresser.  Otherwise she doesn't drive.  She may not recognize familiar routes.  May not remember what she ate for dinner the previous night.  We had sent a referral to [facility] to see if she may qualify for a study.  ***   *** Intensity:  Mild to moderate Duration:  2 hours Frequency:  3 migraines in in October (1 mild and 1 severe) Rescue protocol:  Excedrin for low-grade headache.   Current NSAIDS: No Current analgesics: Excedrin Current triptans: Rizatriptan 10mg  Current ergotamine: None Current anti-emetic: None Current muscle relaxants: tizanidine 2mg  (for neck soreness) Current anti-anxiolytic: None Current sleep aide: None Current Antihypertensive medications: None Current Antidepressant medications: Nortriptyline 50 mg Current Anticonvulsant medications: none Current anti-CGRP: none Current Vitamins/Herbal/Supplements: Multivitamin Current  Antihistamines/Decongestants: None Other therapy: None Hormone/birth control: none Other medications:  donepezil 10mg  at bedtime   Caffeine: Decaf coffee only Alcohol: No Smoker: 5 to 6 cigarettes a day Diet: Does not skip meals.  Decaf coffee and decaf Pepsi.  Needs to increase water intake.  She has been eating more and has gained weight (now about 90 lbs). 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 Depression: No; Anxiety: Yes.  Caring for her ill mother-in-law. Other pain: No Sleep hygiene: Good   HISTORY: Onset:  Age 5 Location:  right temporal/periorbital Quality:  Non-throbbing Initial intensity:  Mild to moderate.  She denies new headache, thunderclap headache or severe headache that wakes her from sleep. Aura:  no Prodrome:  no Postdrome:  no Associated symptoms: Photophobia, phonophobia, dizziness, infrequently nausea.  She denies associated vomiting, visual disturbance, autonomic symptoms, osmophobia or unilateral numbness or weakness. Initial duration:  1 to 2 hours if takes medication at earliest onset Initial Frequency:  8 days a month (4 mild, 3 moderate, 1 severe) Initial Frequency of abortive medication: as needed Triggers:  Allergies, skipping meals, sunlight/glare, change in weather, stress, certain scents Relieving factors:  Excedrin or Zomig Activity:  Severe aggravates   Her family and friends were concerned about her memory problems since 2022.  Problems with recall and word finding difficulty.  Concern because her mother had Alzheimer's disease and her brother has Alzheimer's disease.  Underwent neuropsychological evaluation on 01/14/2022 which revealed impairment involving executive functioning, receptive and expressive language, and verbal aspects of learning and memory meeting criteria of mild neurocognitive disorder but of unclear etiology but a language variant FTD was considered.  Serum labs from 04/14/2021 revealed B12 857  and TSH 2.22.  MRI of brain without  contrast on 04/30/2022  showed mild chronic small vessel ischemic changes but otherwise unremarkable.  PET scan of brain on 05/21/2022 was within normal limits.  Underwent LP on 05/29/2022 which revealed CSF elevated T-Tau protein of 2,511 and 14-3-3 11,286.  Paraneoplastic panel negative, cell count 0, protein 38, glucose 58, negative VDRL, negative hepatitis E IgG, negative HSV 1/2 DNA, negative IgG synthesis/index, negative oligoclonal bands, negative culture, negative fungal culture, negative cryptococcal antigen.       Past NSAIDS:  Aleve, ibuprofen Past analgesics:  acetaminophen, BC powder, Goody powder, Excedrin Past abortive triptans:  sumatriptan tablet, Maxalt, Relpax, Zomig NS (not covered by insurance) Past ergotamines:  No Past muscle relaxants:  Flexeril, Robaxin, Baclofen, Skelaxin Past anti-emetic:  no Past antihypertensive medications:  no Past antidepressant medications:  imipramine Past anticonvulsant medications:  topiramate Past anti-CGRP: no Past vitamins/Herbal/Supplements:  no Past antihistamines/decongestants:  Allegra, Benadryl, Clarinex, Claritin, Dramamine, Flonase, pseudoephedrine Other past therapies:  trigger point injections   Family history of headache:  Paternal grandmother  PAST MEDICAL HISTORY: Past Medical History:  Diagnosis Date   Decreased estrogen level    Hypertension    Migraine headache    Mild cognitive impairment of uncertain or unknown etiology 01/14/2022   Mixed hyperlipidemia    Osteoporosis    Personal history of colonic polyps    Varicose veins of bilateral lower extremities with other complications     MEDICATIONS: Current Outpatient Medications on File Prior to Visit  Medication Sig Dispense Refill   alendronate (FOSAMAX) 70 MG tablet 1 tablet 30 minutes before the first food, beverage or medicine of the day with plain water     amoxicillin (AMOXIL) 500 MG capsule Take 1,000 mg by mouth 2 (two) times daily.     atorvastatin  (LIPITOR) 10 MG tablet TAKE 1 TABLET BY MOUTH EVERY DAY FOR 30 DAYS     donepezil (ARICEPT) 10 MG tablet Take 1 tablet (10 mg total) by mouth at bedtime. 30 tablet 11   lisinopril (ZESTRIL) 20 MG tablet Take 20 mg by mouth daily.     memantine (NAMENDA) 10 MG tablet TAKE 1 TABLET BY MOUTH TWICE A DAY 180 tablet 1   Multiple Vitamin (MULTIVITAMIN) tablet Take 1 tablet by mouth daily.     nortriptyline (PAMELOR) 50 MG capsule TAKE 1 CAPSULE BY MOUTH AT BEDTIME. 90 capsule 1   No current facility-administered medications on file prior to visit.    ALLERGIES: Allergies  Allergen Reactions   Sulfa Antibiotics     FAMILY HISTORY: Family History  Problem Relation Age of Onset   Dementia Mother    Alzheimer's disease Mother    Heart attack Father 75   Alzheimer's disease Brother        onset in early 27s   Benign prostatic hyperplasia Brother    CVA Maternal Aunt    Dementia Maternal Uncle       Objective:  *** General: No acute distress.  Patient appears ***-groomed.   Head:  Normocephalic/atraumatic Eyes:  Fundi examined but not visualized Neck: supple, no paraspinal tenderness, full range of motion Heart:  Regular rate and rhythm Lungs:  Clear to auscultation bilaterally Back: No paraspinal tenderness Neurological Exam: alert and oriented to person, place, and time.  Speech fluent and not dysarthric, language intact.  CN II-XII intact. Bulk and tone normal, muscle strength 5/5 throughout.  Sensation to light touch intact.  Deep tendon reflexes 2+ throughout, toes downgoing.  Finger to nose testing intact.  Gait normal, Romberg negative.   Metta Clines, DO  CC: ***

## 2023-03-16 ENCOUNTER — Ambulatory Visit: Payer: PPO | Admitting: Neurology

## 2023-03-16 ENCOUNTER — Encounter: Payer: Self-pay | Admitting: Neurology

## 2023-03-16 VITALS — BP 153/69 | HR 64 | Ht 62.0 in | Wt 113.0 lb

## 2023-03-16 DIAGNOSIS — F0280 Dementia in other diseases classified elsewhere without behavioral disturbance: Secondary | ICD-10-CM

## 2023-03-16 DIAGNOSIS — G309 Alzheimer's disease, unspecified: Secondary | ICD-10-CM

## 2023-03-16 MED ORDER — MEMANTINE HCL 10 MG PO TABS
10.0000 mg | ORAL_TABLET | Freq: Two times a day (BID) | ORAL | 3 refills | Status: DC
Start: 2023-03-16 — End: 2024-03-23

## 2023-03-16 NOTE — Patient Instructions (Signed)
Continue donepezil 10mg  at bedtime and memantine 10mg  twice daily Refer to [facility] to look into any current studies or treatment options Drive only to church, Producer, television/film/video and best friend's house Follow up 9 months.

## 2023-06-03 ENCOUNTER — Other Ambulatory Visit: Payer: Self-pay | Admitting: Neurology

## 2023-06-16 ENCOUNTER — Other Ambulatory Visit: Payer: Self-pay | Admitting: Neurology

## 2023-08-14 DIAGNOSIS — Z681 Body mass index (BMI) 19 or less, adult: Secondary | ICD-10-CM | POA: Diagnosis not present

## 2023-08-14 DIAGNOSIS — Z23 Encounter for immunization: Secondary | ICD-10-CM | POA: Diagnosis not present

## 2023-08-14 DIAGNOSIS — Z0000 Encounter for general adult medical examination without abnormal findings: Secondary | ICD-10-CM | POA: Diagnosis not present

## 2023-08-14 DIAGNOSIS — N1831 Chronic kidney disease, stage 3a: Secondary | ICD-10-CM | POA: Diagnosis not present

## 2023-08-14 DIAGNOSIS — M810 Age-related osteoporosis without current pathological fracture: Secondary | ICD-10-CM | POA: Diagnosis not present

## 2023-08-14 DIAGNOSIS — G3184 Mild cognitive impairment, so stated: Secondary | ICD-10-CM | POA: Diagnosis not present

## 2023-08-14 DIAGNOSIS — E782 Mixed hyperlipidemia: Secondary | ICD-10-CM | POA: Diagnosis not present

## 2023-08-14 DIAGNOSIS — G309 Alzheimer's disease, unspecified: Secondary | ICD-10-CM | POA: Diagnosis not present

## 2023-08-14 DIAGNOSIS — E46 Unspecified protein-calorie malnutrition: Secondary | ICD-10-CM | POA: Diagnosis not present

## 2023-08-14 DIAGNOSIS — R1312 Dysphagia, oropharyngeal phase: Secondary | ICD-10-CM | POA: Diagnosis not present

## 2023-08-14 DIAGNOSIS — I10 Essential (primary) hypertension: Secondary | ICD-10-CM | POA: Diagnosis not present

## 2023-08-14 DIAGNOSIS — R0989 Other specified symptoms and signs involving the circulatory and respiratory systems: Secondary | ICD-10-CM | POA: Diagnosis not present

## 2023-10-28 ENCOUNTER — Other Ambulatory Visit (HOSPITAL_COMMUNITY): Payer: Self-pay | Admitting: Surgery

## 2023-10-28 DIAGNOSIS — E210 Primary hyperparathyroidism: Secondary | ICD-10-CM

## 2023-10-28 DIAGNOSIS — E213 Hyperparathyroidism, unspecified: Secondary | ICD-10-CM | POA: Diagnosis not present

## 2023-11-02 DIAGNOSIS — E210 Primary hyperparathyroidism: Secondary | ICD-10-CM | POA: Diagnosis not present

## 2023-11-10 ENCOUNTER — Ambulatory Visit (HOSPITAL_COMMUNITY)
Admission: RE | Admit: 2023-11-10 | Discharge: 2023-11-10 | Disposition: A | Discharge status: Home / Self Care | Payer: PPO | Source: Ambulatory Visit | Attending: Surgery | Admitting: Surgery

## 2023-11-10 DIAGNOSIS — E210 Primary hyperparathyroidism: Secondary | ICD-10-CM | POA: Diagnosis not present

## 2023-11-10 DIAGNOSIS — D351 Benign neoplasm of parathyroid gland: Secondary | ICD-10-CM | POA: Diagnosis not present

## 2023-11-10 DIAGNOSIS — E041 Nontoxic single thyroid nodule: Secondary | ICD-10-CM | POA: Diagnosis not present

## 2023-11-13 ENCOUNTER — Encounter (HOSPITAL_COMMUNITY)
Admission: RE | Admit: 2023-11-13 | Discharge: 2023-11-13 | Disposition: A | Discharge status: Home / Self Care | Payer: PPO | Source: Ambulatory Visit | Attending: Surgery | Admitting: Surgery

## 2023-11-13 DIAGNOSIS — E210 Primary hyperparathyroidism: Secondary | ICD-10-CM | POA: Diagnosis present

## 2023-11-13 MED ORDER — TECHNETIUM TC 99M SESTAMIBI GENERIC - CARDIOLITE
25.0000 | Freq: Once | INTRAVENOUS | Status: AC | PRN
  Administered 2023-11-13: 25 via INTRAVENOUS

## 2023-11-16 NOTE — Progress Notes (Signed)
 Results noted.  Awaiting results of nuclear scan.  tmg  Darnell Level, MD [facility] practice Office: [phone]

## 2023-11-22 NOTE — Progress Notes (Signed)
 Both the USN and sestamibi scans are negative for parathyroid  adenoma.  [facility] - please arrange a 4D-CT scan for this patient.  I will contact her with the results when they are available.  Krystal Spinner, MD [facility] practice Office: [phone]

## 2023-11-25 ENCOUNTER — Other Ambulatory Visit: Payer: Self-pay | Admitting: Surgery

## 2023-11-25 DIAGNOSIS — E210 Primary hyperparathyroidism: Secondary | ICD-10-CM

## 2023-12-16 NOTE — Progress Notes (Signed)
 NEUROLOGY FOLLOW UP OFFICE NOTE  Deanna [name] [id number]  Assessment/Plan:   Major neurocognitive disorder secondary to Alzheimer's disease Hypertension     1.  Dementia management:  donepezil  10mg  at bedtime, memantine  10mg  twice daily 2.  Socialization 4.  Exercise 5.  Refer to [facility] for potential eligibility of current memory care studies and treatment options. 6.  Advised to contact PCP immediately or go to ED 7.  Follow up 9 months.   Subjective:  Deanna [name] is a 73 year old right-handed female with mitral valve prolapse and hyperparathyroidism who follows up for migraines.   UPDATE: On donepezil  10mg  at bedtime and memantine  10mg  twice daily.  Last visit, she was referred to [facility] for potential eligibility of current memory care studies and treatment options.  She did not receive a phone call to schedule an appointment, or at least nobody left a message.    Husband recognizes short term memory worse.  Forgets watching recent TV show or what she ate for dinner a couple of nights ago.  May not remember going to certain restaurants.  Still with trouble with remembering names and word-finding difficulty.  She no longer is driving anymore.  She started struggling with remembering where the windshield wipers are.  Once she left the stove on, so she no longer cooks other than the microwave.  Long-term memory intact.  She frequently has dreams but doesn't remember what they were about.  Appetite varies.  Overall has maintained her weight.    Looking into moving to [facility] or [employer].      HISTORY: Her family and friends were concerned about her memory problems since 2022.  Problems with recall and word finding difficulty.  Concern because her mother had Alzheimer's disease and her brother has Alzheimer's disease.  Underwent neuropsychological evaluation on 01/14/2022 which revealed impairment involving executive functioning, receptive and expressive  language, and verbal aspects of learning and memory meeting criteria of mild neurocognitive disorder but of unclear etiology but a language variant FTD was considered.  Serum labs from 04/14/2021 revealed B12 857 and TSH 2.22.  MRI of brain without contrast on 04/30/2022  showed mild chronic small vessel ischemic changes but otherwise unremarkable.  PET scan of brain on 05/21/2022 was within normal limits.  Underwent LP on 05/29/2022 which revealed CSF elevated T-Tau protein of 2,511 and 14-3-3 11,286.  Paraneoplastic panel negative, cell count 0, protein 38, glucose 58, negative VDRL, negative hepatitis E IgG, negative HSV 1/2 DNA, negative IgG synthesis/index, negative oligoclonal bands, negative culture, negative fungal culture, negative cryptococcal antigen.       PAST MEDICAL HISTORY: Past Medical History:  Diagnosis Date   Decreased estrogen level    Hypertension    Migraine headache    Mild cognitive impairment of uncertain or unknown etiology 01/14/2022   Mixed hyperlipidemia    Osteoporosis    Personal history of colonic polyps    Varicose veins of bilateral lower extremities with other complications     MEDICATIONS: Current Outpatient Medications on File Prior to Visit  Medication Sig Dispense Refill   alendronate (FOSAMAX) 70 MG tablet 1 tablet 30 minutes before the first food, beverage or medicine of the day with plain water     atorvastatin (LIPITOR) 10 MG tablet TAKE 1 TABLET BY MOUTH EVERY DAY FOR 30 DAYS     donepezil  (ARICEPT ) 10 MG tablet TAKE 1 TABLET BY MOUTH EVERYDAY AT BEDTIME 90 tablet 3   lisinopril (ZESTRIL) 20 MG tablet Take  20 mg by mouth daily.     memantine  (NAMENDA ) 10 MG tablet Take 1 tablet (10 mg total) by mouth 2 (two) times daily. 180 tablet 3   Multiple Vitamin (MULTIVITAMIN) tablet Take 1 tablet by mouth daily.     vitamin B-12 (CYANOCOBALAMIN) 500 MCG tablet Take 500 mcg by mouth daily.     No current facility-administered medications on file prior to visit.     ALLERGIES: Allergies  Allergen Reactions   Sulfa Antibiotics     FAMILY HISTORY: Family History  Problem Relation Age of Onset   Dementia Mother    Alzheimer's disease Mother    Heart attack Father 39   Alzheimer's disease Brother        onset in early 38s   Benign prostatic hyperplasia Brother    CVA Maternal Aunt    Dementia Maternal Uncle       Objective:  Blood pressure (!) 153/69, pulse 64, height 5' 2 (1.575 m), weight 113 lb (51.3 kg), SpO2 96 %. General: No acute distress.  Patient appears well-groomed.   Head:  Normocephalic/atraumatic Eyes:  Fundi examined but not visualized Neck: supple, no paraspinal tenderness, full range of motion Heart:  Regular rate and rhythm Lungs:  Clear to auscultation bilaterally Back: No paraspinal tenderness Neurological Exam: alert and oriented to person, place, and time.  Speech fluent and not dysarthric, language intact.      04/14/2022    1:00 PM 03/16/2023    9:00 AM 12/17/2023   10:00 AM  [employer] Mental Exam  Weekday Correct 1 1 1   Current year 1 1 1   What state are we in? 1 1 1   Amount spent 0 1 1  Amount left 0 0 0  # of Animals 1 1 1  5  objects recall 2 3 2   Number series 1 1 1   Hour markers 0 0 0  Time correct 0 0 0  Placed X in triangle correctly 1 1 0  Largest Figure 1 1 1   Name of female 2 0 2  Date back to work 0 0 0  Type of work 2 1 2   State she lived in 0 0 0  Total score 13 12 13    CN II-XII intact. Bulk and tone normal, muscle strength 5/5 throughout.  Sensation to light touch intact.  Deep tendon reflexes 2+ throughout, toes downgoing.  Finger to nose testing intact.  Gait normal, Romberg negative.   Juliene Dunnings, DO  CC: Neale Carpen, NP

## 2023-12-17 ENCOUNTER — Encounter: Payer: Self-pay | Admitting: Neurology

## 2023-12-17 ENCOUNTER — Ambulatory Visit: Payer: PPO | Admitting: Neurology

## 2023-12-17 VITALS — BP 177/77 | Wt 113.0 lb

## 2023-12-17 DIAGNOSIS — F0280 Dementia in other diseases classified elsewhere without behavioral disturbance: Secondary | ICD-10-CM

## 2023-12-17 DIAGNOSIS — G309 Alzheimer's disease, unspecified: Secondary | ICD-10-CM

## 2023-12-17 NOTE — Patient Instructions (Signed)
 Continue donepezil  and memantine  Will resend referral to [facility] to discuss other potential treatments.

## 2023-12-18 ENCOUNTER — Ambulatory Visit
Admission: RE | Admit: 2023-12-18 | Discharge: 2023-12-18 | Disposition: A | Discharge status: Home / Self Care | Payer: PPO | Source: Ambulatory Visit | Attending: Surgery | Admitting: Surgery

## 2023-12-18 DIAGNOSIS — E210 Primary hyperparathyroidism: Secondary | ICD-10-CM

## 2023-12-18 MED ORDER — IOPAMIDOL (ISOVUE-370) INJECTION 76%
80.0000 mL | Freq: Once | INTRAVENOUS | Status: AC | PRN
  Administered 2023-12-18: 80 mL via INTRAVENOUS

## 2024-01-13 ENCOUNTER — Ambulatory Visit: Payer: Self-pay | Admitting: Surgery

## 2024-01-13 ENCOUNTER — Encounter: Payer: Self-pay | Admitting: Surgery

## 2024-01-13 NOTE — Progress Notes (Signed)
 Telephone call to patient. USN and CT demonstrate a 6 mm nodule at the left inferior position likely representing a parathyroid  adenoma.  Discussed minimally invasive surgery versus neck exploration with patient.  Will send orders to schedulers and have them contact patient today.  Krystal Spinner, MD [facility] practice Office: [phone]

## 2024-01-21 ENCOUNTER — Encounter: Payer: Self-pay | Admitting: Speech Pathology

## 2024-01-21 ENCOUNTER — Ambulatory Visit: Attending: Family Medicine | Admitting: Speech Pathology

## 2024-01-21 DIAGNOSIS — R1310 Dysphagia, unspecified: Secondary | ICD-10-CM | POA: Insufficient documentation

## 2024-01-21 NOTE — Therapy (Unsigned)
 OUTPATIENT SPEECH LANGUAGE PATHOLOGY SWALLOW EVALUATION   Patient Name: Deanna [name] MRN: [id number] DOB:1950/12/12, 73 y.o., female Today's Date: 01/22/2024  PCP: Orson Eva, NP REFERRING PROVIDER: Inez Pilgrim, NP  END OF SESSION:  End of Session - 01/21/24 1405     Visit Number 1    Number of Visits 5    Date for SLP Re-Evaluation 03/24/24    Authorization Type HTA    SLP Start Time 1357    SLP Stop Time  1440    SLP Time Calculation (min) 43 min    Activity Tolerance Patient tolerated treatment well             Past Medical History:  Diagnosis Date   Decreased estrogen level    Hypertension    Migraine headache    Mild cognitive impairment of uncertain or unknown etiology 01/14/2022   Mixed hyperlipidemia    Osteoporosis    Personal history of colonic polyps    Varicose veins of bilateral lower extremities with other complications    Past Surgical History:  Procedure Laterality Date   BREAST BIOPSY Left    WISDOM TOOTH EXTRACTION     Patient Active Problem List   Diagnosis Date Noted   Mild cognitive impairment of uncertain or unknown etiology 01/14/2022   Decreased estrogen level    Hypertension    Migraine headache    Mixed hyperlipidemia    Osteoporosis    History of colonic polyps    Varicose veins of bilateral lower extremities with other complications     ONSET DATE: 01/15/2024 referral   REFERRING DIAG:  R13.12 (ICD-10-CM) - Dysphagia, oropharyngeal phase    THERAPY DIAG:  Dysphagia, unspecified type  Rationale for Evaluation and Treatment: Rehabilitation  SUBJECTIVE:   SUBJECTIVE STATEMENT "Things get stuck when swallowing" Pt accompanied by: SpouseTrey Paula  PERTINENT HISTORY: PMHx dementia, migraines. Reports longstanding intermittent dysphagia complaints, c/b challenges swallowing pills and tough meats. Feels as though symptoms have worsened over past 2 years.   PAIN:  Are you having pain? No  FALLS: Has patient fallen  in last 6 months?  No  LIVING ENVIRONMENT: Lives with: lives with their family and lives with their spouse Lives in: House/apartment  PLOF:  Level of assistance: Independent with ADLs Employment: Retired , Social worker  PATIENT GOALS: "Find out what's wrong and what we can do to fix it"  OBJECTIVE:  Note: Objective measures were completed at Evaluation unless otherwise noted. OBJECTIVE:   INSTRUMENTAL SWALLOW STUDY FINDINGS (MBSS) To be scheduled   COGNITION: Overall cognitive status: Within functional limits for tasks assessed  SUBJECTIVE DYSPHAGIA REPORTS:  Date of onset: at least a year ago per pt report Reported symptoms: coughing with both solids and liquids, globus sensation, and weak voice  Current diet: regular  Co-morbid voice changes: Yes  FACTORS WHICH MAY INCREASE RISK OF ADVERSE EVENT IN PRESENCE OF ASPIRATION:  General health: well appearing  Risk factors: none evident     ORAL MOTOR EXAMINATION: Overall status: WFL  CLINICAL SWALLOW ASSESSMENT:   Dentition: adequate natural dentition Vocal quality at baseline: hoarse and strained Patient directly observed with POs: Yes: thin liquids  Feeding: able to feed self Liquids provided by: cup [facility] Swallow Protocol: Pass Oral phase signs and symptoms:  N/A Pharyngeal phase signs and symptoms: complaints of globus  PATIENT REPORTED OUTCOME MEASURES (PROM):  Question Patient's Response  My swallowing problem has caused me to lose weight 0  2.  My swallowing problem interferes with my ability  to go out to meals 1  3.  Swallowing liquids takes extra effort 0  4.  Swallowing solids takes extra effort 1  5.  Swallowing pills takes extra effort 3  6.  Swallowing is painful 2  7.  The pleasure of eating is affected by my swallowing 1  8.  When I swallow food sticks in my throat 4  9.  I cough when I eat 2  10.  Swallowing is stressful  0  0= No problem 4= Severe problem                                                                                                                              TREATMENT DATE:  01/21/24: SLP completed pt evaluation and discussed next steps and plan for pt to return after upcoming surgery in April to determine need for skilled ST. SLP provided pt education on compensatory swallow strategies including taking smaller bites/sips and drinking water in between bites.   PATIENT EDUCATION: Education details: See above. Person educated: Patient and Spouse Education method: Explanation Education comprehension: verbalized understanding and needs further education   ASSESSMENT:  CLINICAL IMPRESSION: Patient is a 73 y.o. F who was seen today for a swallow eval accompanied by spouse: Trey Paula. Pt expressed concerns regarding globus sensation, and feeling of "things getting stuck" or not going all the way down when swallowing. Pt shared this occurs with both liquids and solids, but reports more trouble with "dry" foods. 3 oz water challenge completed with no overt s/sx aspiration. Pt is in relatively good health with intact natural dentition, endorsing daily oral care. Suggest ongoing current diet with esophageal precautions of bolus size modification, increased liquids during PO intake, small meals, upright positioning. Pt confirmed upcoming parathyroidectomy to address other health concerns and potential esophageal issues. Pt expressed desire to complete surgery prior to completing a swallow study and initiating speech therapy. Plan to complete instrumental swallow study second half of April, with schedule follow up visits after that appointment is scheduled.   OBJECTIVE IMPAIRMENTS: include dysphagia. These impairments are limiting patient from  enjoying meals and comfortable swallowing . Factors affecting potential to achieve goals and functional outcome are ability to learn/carryover information. Patient will benefit from skilled SLP services to address above impairments and improve overall  function.  REHAB POTENTIAL: Good   GOALS: Goals reviewed with patient? Yes  LONG TERM GOALS: Target date: 03/18/2024   Pt will complete Modified Barium Swallow Study.   Baseline: Goal status: INITIAL  2.  Pt will complete HEP with mod-A and a model, if indicated per MBS. Baseline:  Goal status: INITIAL   PLAN:  SLP FREQUENCY: 1x/week, following MBSS completion  SLP DURATION: 8 weeks  PLANNED INTERVENTIONS: Aspiration precaution training, Pharyngeal strengthening exercises, Diet toleration management , Environmental controls, Cueing hierachy, SLP instruction and feedback, Compensatory strategies, and [address] Treatment of speech (30 or 45 min)     Maia Breslow, CCC-SLP 01/22/2024, 8:34  AM

## 2024-01-21 NOTE — Patient Instructions (Signed)
 Take small sips between bites! Helps push everything down. The more you do it you can create a positive habit. Take your time when eating!

## 2024-01-24 ENCOUNTER — Encounter: Payer: Self-pay | Admitting: Surgery

## 2024-01-24 DIAGNOSIS — E210 Primary hyperparathyroidism: Secondary | ICD-10-CM | POA: Diagnosis present

## 2024-01-24 NOTE — H&P (Signed)
 REFERRING PHYSICIAN: Inez Pilgrim  PROVIDER: Jeriah Skufca Myra Rude, MD   Chief Complaint: New Consultation (Primary hyperparathyroidism)  History of Present Illness:  Patient is referred by Inez Pilgrim, NP, for surgical evaluation and management of newly diagnosed primary hyperparathyroidism. Patient had been noted on routine laboratory studies to have an elevated serum calcium level at 11.3. Intact PTH level was elevated at 76. Patient has not had any imaging studies. Patient does have a history of osteoporosis. She is on alendronate. Patient notes fatigue. She notes issues with memory although she is being treated for early dementia. She has had a history of depression. She complains of bone and joint pain. She notes urinary frequency. She has had some problems with constipation. Patient has had no prior head or neck surgery. There is no family history of parathyroid disease or other endocrine neoplasm. She is accompanied by a family member.  Review of Systems: A complete review of systems was obtained from the patient. I have reviewed this information and discussed as appropriate with the patient. See HPI as well for other ROS.  Review of Systems  Constitutional: Positive for malaise/fatigue.  HENT: Positive for sore throat.  Eyes: Negative.  Respiratory: Negative.  Cardiovascular: Negative.  Gastrointestinal: Positive for constipation.  Genitourinary: Positive for frequency.  Musculoskeletal: Positive for joint pain.  Skin: Negative.  Neurological: Negative. Negative for tremors.  Endo/Heme/Allergies: Negative.  Psychiatric/Behavioral: Positive for depression and memory loss.    Medical History: Past Medical History:  Diagnosis Date  Hyperlipidemia  Hypertension   Patient Active Problem List  Diagnosis  Primary hyperparathyroidism (CMS/HHS-HCC)   History reviewed. No pertinent surgical history.   Allergies  Allergen Reactions  Sulfa (Sulfonamide Antibiotics)  Unknown   Current Outpatient Medications on File Prior to Visit  Medication Sig Dispense Refill  alendronate (FOSAMAX) 70 MG tablet 1 tablet 30 minutes before the first food, beverage or medicine of the day with plain water Orally Once a week for 90 days  atorvastatin (LIPITOR) 10 MG tablet Take 10 mg by mouth once daily  donepeziL (ARICEPT) 10 MG tablet Take 10 mg by mouth at bedtime  lisinopriL (ZESTRIL) 20 MG tablet Take 20 mg by mouth once daily  memantine (NAMENDA) 10 MG tablet Take 10 mg by mouth 2 (two) times daily   No current facility-administered medications on file prior to visit.   Family History  Problem Relation Age of Onset  Alzheimer's disease Mother  Coronary Artery Disease (Blocked arteries around heart) Father  Alzheimer's disease Brother    Social History   Tobacco Use  Smoking Status Former  Types: Cigarettes  Smokeless Tobacco Never    Social History   Socioeconomic History  Marital status: Married  Tobacco Use  Smoking status: Former  Types: Cigarettes  Smokeless tobacco: Never  Substance and Sexual Activity  Alcohol use: Never  Drug use: Never   Objective:   Vitals:  BP: 138/80  Pulse: 78  Temp: 36.6 C (97.8 F)  SpO2: 98%  Weight: (!) 43.9 kg (96 lb 12.8 oz)  Height: 152.4 cm (5')  PainSc: 0-No pain   Body mass index is 18.9 kg/m.  Physical Exam   GENERAL APPEARANCE Comfortable, no acute issues Development: normal Gross deformities: none  SKIN Rash, lesions, ulcers: none Induration, erythema: none Nodules: none palpable  EYES Conjunctiva and lids: normal Pupils: equal  EARS, NOSE, MOUTH, THROAT External ears: no lesion or deformity External nose: no lesion or deformity Hearing: grossly normal  NECK Symmetric: yes Trachea:  midline Thyroid: no palpable nodules in the thyroid bed  CHEST/CV Not assessed  ABDOMEN Not assessed  GENITOURINARY/RECTAL Not assessed  MUSCULOSKELETAL Station and gait:  normal Digits and nails: no clubbing or cyanosis Muscle strength: grossly normal all extremities Deformity: none  LYMPHATIC Cervical: none palpable Supraclavicular: none palpable  PSYCHIATRIC Oriented to person, place, and time: yes Mood and affect: normal for situation Judgment and insight: appropriate for situation   Assessment and Plan:   Primary hyperparathyroidism (CMS/HHS-HCC)  Patient is referred by her primary care provider for surgical evaluation and management of suspected primary hyperparathyroidism.  Patient provided with a copy of "Parathyroid Surgery: Treatment for Your Parathyroid Gland Problem", published by [employer], 12 pages. Book reviewed and explained to patient during visit today.  Today we reviewed her clinical history, her laboratory studies, and discussed her symptoms. I would like to proceed with further evaluation. We will order a 24-hour urine collection for calcium. We will also ask for a 25-hydroxy vitamin D level. We will order imaging studies to include an ultrasound of the neck as well as a nuclear medicine parathyroid scan with sestamibi. Hopefully the studies will confirm the diagnosis of primary hyperparathyroidism and localize a parathyroid adenoma. If so she should be a good candidate for minimally invasive outpatient surgery. We discussed that procedure today. We discussed the size and location of the surgical incision. We discussed the hospital stay and the postoperative recovery.  If the above studies do not indicate a parathyroid adenoma, then we will consider performing a 4D CT scan of the neck with parathyroid protocol.  Patient will undergo the above studies. We will contact her with the results when they are available and make plans for further management at that time.   Darnell Level, MD [facility] A [facility] practice Office: [phone]

## 2024-01-25 ENCOUNTER — Other Ambulatory Visit (HOSPITAL_COMMUNITY): Payer: Self-pay | Admitting: *Deleted

## 2024-01-25 DIAGNOSIS — R1310 Dysphagia, unspecified: Secondary | ICD-10-CM

## 2024-01-26 NOTE — Patient Instructions (Signed)
 SURGICAL WAITING ROOM VISITATION Patients having surgery or a procedure may have no more than 2 support people in the waiting area - these visitors may rotate in the visitor waiting room.   Due to an increase in RSV and influenza rates and associated hospitalizations, children ages 49 and under may not visit patients in [facility] hospitals. If the patient needs to stay at the hospital during part of their recovery, the visitor guidelines for inpatient rooms apply.  PRE-OP VISITATION  Pre-op nurse will coordinate an appropriate time for 1 support person to accompany the patient in pre-op.  This support person may not rotate.  This visitor will be contacted when the time is appropriate for the visitor to come back in the pre-op area.  Please refer to the [facility] website for the visitor guidelines for Inpatients (after your surgery is over and you are in a regular room).  You are not required to quarantine at this time prior to your surgery. However, you must do this: Hand Hygiene often Do NOT share personal items Notify your provider if you are in close contact with someone who has COVID or you develop fever 100.4 or greater, new onset of sneezing, cough, sore throat, shortness of breath or body aches.  If you test positive for Covid or have been in contact with anyone that has tested positive in the last 10 days please notify you surgeon.    Your procedure is scheduled on:  Thursday   January 28, 2024  Report to [facility] Main Entrance: [facility] entrance where the [employer] is available.   Report to admitting at:  08:45   AM  Call this number if you have any questions or problems the morning of surgery [phone]  FOLLOW ANY ADDITIONAL PRE OP INSTRUCTIONS YOU RECEIVED FROM YOUR SURGEON'S OFFICE!!!  Do not eat food after Midnight the night prior to your surgery/procedure.  After Midnight you may have the following liquids until 08:00 AM DAY OF SURGERY  Clear Liquid  Diet Water Black Coffee (sugar ok, NO MILK/CREAM OR CREAMERS)  Tea (sugar ok, NO MILK/CREAM OR CREAMERS) regular and decaf                             Plain Jell-O  with no fruit (NO RED)                                           Fruit ices (not with fruit pulp, NO RED)                                     Popsicles (NO RED)                                                                  Juice: NO CITRUS JUICES: only apple, WHITE grape, WHITE cranberry Sports drinks like Gatorade or Powerade (NO RED)               Oral Hygiene is also important to reduce your risk of infection.  Remember - BRUSH YOUR TEETH THE MORNING OF SURGERY WITH YOUR REGULAR TOOTHPASTE  Do NOT smoke after Midnight the night before surgery.  STOP TAKING all Vitamins, Herbs and supplements 1 week before your surgery.   Take ONLY these medicines the morning of surgery with A SIP OF WATER: Memantine (Namenda)                    You may not have any metal on your body including hair pins, jewelry, and body piercing  Do not wear make-up, lotions, powders, perfumes or deodorant  Do not wear nail polish including gel and S&S, artificial / acrylic nails, or any other type of covering on natural nails including finger and toenails. If you have artificial nails, gel coating, etc., that needs to be removed by a nail salon, Please have this removed prior to surgery. Not doing so may mean that your surgery could be cancelled or delayed if the Surgeon or anesthesia staff feels like they are unable to monitor you safely.   Do not shave 48 hours prior to surgery to avoid nicks in your skin which may contribute to postoperative infections.   Contacts, Hearing Aids, dentures or bridgework may not be worn into surgery. DENTURES WILL BE REMOVED PRIOR TO SURGERY PLEASE DO NOT APPLY "Poly grip" OR ADHESIVES!!!  You may bring a small overnight bag with you on the day of surgery, only pack items that are not valuable. [facility] IS  NOT RESPONSIBLE   FOR VALUABLES THAT ARE LOST OR STOLEN.   Patients discharged on the day of surgery will not be allowed to drive home.  Someone NEEDS to stay with you for the first 24 hours after anesthesia.  Do not bring your home medications to the hospital. The Pharmacy will dispense medications listed on your medication list to you during your admission in the Hospital.  Please read over the following fact sheets you were given: IF YOU HAVE QUESTIONS ABOUT YOUR PRE-OP INSTRUCTIONS, PLEASE CALL [phone]   [facility] - Preparing for Surgery Before surgery, you can play an important role.  Because skin is not sterile, your skin needs to be as free of germs as possible.  You can reduce the number of germs on your skin by washing with CHG (chlorahexidine gluconate) soap before surgery.  CHG is an antiseptic cleaner which kills germs and bonds with the skin to continue killing germs even after washing. Please DO NOT use if you have an allergy to CHG or antibacterial soaps.  If your skin becomes reddened/irritated stop using the CHG and inform your nurse when you arrive at Short Stay. Do not shave (including legs and underarms) for at least 48 hours prior to the first CHG shower.  You may shave your face/neck.  Please follow these instructions carefully:  1.  Shower with CHG Soap the night before surgery and the  morning of surgery.  2.  If you choose to wash your hair, wash your hair first as usual with your normal  shampoo.  3.  After you shampoo, rinse your hair and body thoroughly to remove the shampoo.                             4.  Use CHG as you would any other liquid soap.  You can apply chg directly to the skin and wash.  Gently with a scrungie or clean washcloth.  5.  Apply the  CHG Soap to your body ONLY FROM THE NECK DOWN.   Do not use on face/ open                           Wound or open sores. Avoid contact with eyes, ears mouth and genitals (private parts).                        Wash face,  Genitals (private parts) with your normal soap.             6.  Wash thoroughly, paying special attention to the area where your  surgery  will be performed.  7.  Thoroughly rinse your body with warm water from the neck down.  8.  DO NOT shower/wash with your normal soap after using and rinsing off the CHG Soap.            9.  Pat yourself dry with a clean towel.            10.  Wear clean pajamas.            11.  Place clean sheets on your bed the night of your first shower and do not  sleep with pets.  ON THE DAY OF SURGERY : Do not apply any lotions/deodorants the morning of surgery.  Please wear clean clothes to the hospital/surgery center.     FAILURE TO FOLLOW THESE INSTRUCTIONS MAY RESULT IN THE CANCELLATION OF YOUR SURGERY  PATIENT SIGNATURE_________________________________  NURSE SIGNATURE__________________________________  ________________________________________________________________________

## 2024-01-26 NOTE — Progress Notes (Signed)
 COVID Vaccine received:  []  No [x]  Yes Date of any COVID positive Test in last 90 days:  None PCP - Inez Pilgrim, NP at [facility] [phone]  Cardiologist -  none Neurology- Shon Millet, DO   Chest x-ray -  EKG -  none found   do at PST Stress Test -  ECHO -  Cardiac Cath -   PCR screen: []  Ordered & Completed []   No Order but Needs PROFEND     [x]   N/A for this surgery  Surgery Plan:  [x]  Ambulatory   []  Outpatient in bed  []  Admit Anesthesia:    [x]  General  []  Spinal  []   Choice []   MAC  Pacemaker / ICD device [x]  No []  Yes   Spinal Cord Stimulator:[x]  No []  Yes       History of Sleep Apnea? [x]  No []  Yes   CPAP used?- [x]  No []  Yes    Does the patient monitor blood sugar?   [x]  N/A   []  No []  Yes  Patient has: [x]  NO Hx DM   []  Pre-DM   []  DM1  []   DM2  Blood Thinner / Instructions: none Aspirin Instructions: none  ERAS Protocol Ordered: []  No  [x]  Yes PRE-SURGERY []  ENSURE  []  G2   [x]  No Drink Ordered Patient is to be NPO after: 0800  Dental hx: []  Dentures:  [x]  N/A      []  Bridge or Partial:                   []  Loose or Damaged teeth:   Comments: Patient was lucid and appropriate with her answers to my PST questions. Her husband was present and did correct some of her answers d/t short term memory loss. She has been taking all  her medications as directed with no lapses.   Activity level: Patient is able to climb a flight of stairs without difficulty; [x]  No CP  [x]  No SOB,  Patient can perform ADLs without assistance.   Anesthesia review: mild Alzheimer's dementia- short term memory loss, HTN, Oropharyngeal Dysphagia, smoker, CKD3,  Heart Murmur  mentioned in Dr. Moises Blood and PCP's note (per Shanda Bumps- she is Okay for surgery tomorrow)  Patient denies shortness of breath, fever, cough and chest pain at PAT appointment.  Patient verbalized understanding and agreement to the Pre-Surgical Instructions that were given to them at this PAT appointment. Patient was also  educated of the need to review these PAT instructions again prior to her surgery.I reviewed the appropriate phone numbers to call if they have any and questions or concerns.

## 2024-01-27 ENCOUNTER — Encounter (HOSPITAL_COMMUNITY)
Admission: RE | Admit: 2024-01-27 | Discharge: 2024-01-27 | Disposition: A | Discharge status: Home / Self Care | Source: Ambulatory Visit | Attending: Surgery | Admitting: Surgery

## 2024-01-27 ENCOUNTER — Other Ambulatory Visit: Payer: Self-pay

## 2024-01-27 ENCOUNTER — Encounter (HOSPITAL_COMMUNITY): Payer: Self-pay

## 2024-01-27 VITALS — BP 178/80 | HR 64 | Temp 98.3°F | Resp 16 | Ht 62.0 in | Wt 93.0 lb

## 2024-01-27 DIAGNOSIS — I10 Essential (primary) hypertension: Secondary | ICD-10-CM

## 2024-01-27 DIAGNOSIS — R9431 Abnormal electrocardiogram [ECG] [EKG]: Secondary | ICD-10-CM | POA: Insufficient documentation

## 2024-01-27 DIAGNOSIS — E210 Primary hyperparathyroidism: Secondary | ICD-10-CM

## 2024-01-27 DIAGNOSIS — Z01810 Encounter for preprocedural cardiovascular examination: Secondary | ICD-10-CM | POA: Diagnosis present

## 2024-01-27 DIAGNOSIS — Z01818 Encounter for other preprocedural examination: Secondary | ICD-10-CM

## 2024-01-27 DIAGNOSIS — Z01812 Encounter for preprocedural laboratory examination: Secondary | ICD-10-CM | POA: Diagnosis present

## 2024-01-27 HISTORY — DX: Dysphagia, unspecified: R13.10

## 2024-01-27 HISTORY — DX: Cardiac murmur, unspecified: R01.1

## 2024-01-27 HISTORY — DX: Unspecified dementia, unspecified severity, without behavioral disturbance, psychotic disturbance, mood disturbance, and anxiety: F03.90

## 2024-01-27 LAB — BASIC METABOLIC PANEL
Anion gap: 8 (ref 5–15)
BUN: 15 mg/dL (ref 8–23)
CO2: 28 mmol/L (ref 22–32)
Calcium: 10.9 mg/dL — ABNORMAL HIGH (ref 8.9–10.3)
Chloride: 105 mmol/L (ref 98–111)
Creatinine, Ser: 0.99 mg/dL (ref 0.44–1.00)
GFR, Estimated: >60 mL/min (ref >60)
Glucose, Bld: 93 mg/dL (ref 70–99)
Potassium: 3.8 mmol/L (ref 3.5–5.1)
Sodium: 141 mmol/L (ref 135–145)

## 2024-01-27 LAB — CBC
HCT: 46.5 % — ABNORMAL HIGH (ref 36.0–46.0)
Hemoglobin: 14.5 g/dL (ref 12.0–15.0)
MCH: 28.8 pg (ref 26.0–34.0)
MCHC: 31.2 g/dL (ref 30.0–36.0)
MCV: 92.4 fL (ref 80.0–100.0)
Platelets: 204 10*3/uL (ref 150–400)
RBC: 5.03 MIL/uL (ref 3.87–5.11)
RDW: 13.2 % (ref 11.5–15.5)
WBC: 6.8 10*3/uL (ref 4.0–10.5)
nRBC: 0 % (ref 0.0–0.2)

## 2024-01-28 ENCOUNTER — Encounter (HOSPITAL_COMMUNITY)
Admission: RE | Disposition: A | Discharge status: Home / Self Care | Payer: Self-pay | Source: Home / Self Care | Attending: Surgery

## 2024-01-28 ENCOUNTER — Ambulatory Visit (HOSPITAL_COMMUNITY)
Admission: RE | Admit: 2024-01-28 | Discharge: 2024-01-28 | Disposition: A | Discharge status: Home / Self Care | Attending: Surgery | Admitting: Surgery

## 2024-01-28 ENCOUNTER — Ambulatory Visit (HOSPITAL_COMMUNITY): Payer: Self-pay | Admitting: Physician Assistant

## 2024-01-28 ENCOUNTER — Encounter (HOSPITAL_COMMUNITY): Payer: Self-pay | Admitting: Surgery

## 2024-01-28 ENCOUNTER — Other Ambulatory Visit: Payer: Self-pay

## 2024-01-28 ENCOUNTER — Ambulatory Visit (HOSPITAL_BASED_OUTPATIENT_CLINIC_OR_DEPARTMENT_OTHER): Admitting: Certified Registered Nurse Anesthetist

## 2024-01-28 DIAGNOSIS — Z87891 Personal history of nicotine dependence: Secondary | ICD-10-CM | POA: Insufficient documentation

## 2024-01-28 DIAGNOSIS — I10 Essential (primary) hypertension: Secondary | ICD-10-CM | POA: Insufficient documentation

## 2024-01-28 DIAGNOSIS — E210 Primary hyperparathyroidism: Secondary | ICD-10-CM

## 2024-01-28 DIAGNOSIS — Z79899 Other long term (current) drug therapy: Secondary | ICD-10-CM | POA: Insufficient documentation

## 2024-01-28 DIAGNOSIS — D351 Benign neoplasm of parathyroid gland: Secondary | ICD-10-CM | POA: Insufficient documentation

## 2024-01-28 DIAGNOSIS — E0590 Thyrotoxicosis, unspecified without thyrotoxic crisis or storm: Secondary | ICD-10-CM | POA: Diagnosis not present

## 2024-01-28 DIAGNOSIS — R519 Headache, unspecified: Secondary | ICD-10-CM | POA: Diagnosis not present

## 2024-01-28 SURGERY — PARATHYROIDECTOMY
Anesthesia: General | Laterality: Left

## 2024-01-28 MED ORDER — MIDAZOLAM HCL 5 MG/5ML IJ SOLN
INTRAMUSCULAR | Status: DC | PRN
  Administered 2024-01-28: 1 mg via INTRAVENOUS

## 2024-01-28 MED ORDER — ORAL CARE MOUTH RINSE: 15.0000 mL | Freq: Once | OROMUCOSAL | Status: AC

## 2024-01-28 MED ORDER — PROPOFOL 10 MG/ML IV BOLUS
INTRAVENOUS | Status: DC | PRN
  Administered 2024-01-28: 110 mg via INTRAVENOUS

## 2024-01-28 MED ORDER — PROPOFOL 10 MG/ML IV BOLUS
INTRAVENOUS | Status: AC
  Filled 2024-01-28: qty 20

## 2024-01-28 MED ORDER — HEMOSTATIC AGENTS (NO CHARGE) OPTIME
TOPICAL | Status: DC | PRN
  Administered 2024-01-28: 1 via TOPICAL

## 2024-01-28 MED ORDER — 0.9 % SODIUM CHLORIDE (POUR BTL) OPTIME
TOPICAL | Status: DC | PRN
  Administered 2024-01-28: 1000 mL

## 2024-01-28 MED ORDER — ACETAMINOPHEN 160 MG/5ML PO SOLN: 1000.0000 mg | Freq: Once | ORAL | Status: DC | PRN

## 2024-01-28 MED ORDER — LIDOCAINE 2% (20 MG/ML) 5 ML SYRINGE
INTRAMUSCULAR | Status: DC | PRN
  Administered 2024-01-28: 60 mg via INTRAVENOUS

## 2024-01-28 MED ORDER — CEFAZOLIN SODIUM-DEXTROSE 2-4 GM/100ML-% IV SOLN
2.0000 g | INTRAVENOUS | Status: AC
  Administered 2024-01-28: 2 g via INTRAVENOUS
  Filled 2024-01-28: qty 100

## 2024-01-28 MED ORDER — EPHEDRINE 5 MG/ML INJ
INTRAVENOUS | Status: AC
  Filled 2024-01-28: qty 5

## 2024-01-28 MED ORDER — OXYCODONE HCL 5 MG/5ML PO SOLN: 5.0000 mg | Freq: Once | ORAL | Status: DC | PRN

## 2024-01-28 MED ORDER — CHLORHEXIDINE GLUCONATE CLOTH 2 % EX PADS: 6.0000 | MEDICATED_PAD | Freq: Once | CUTANEOUS | Status: DC

## 2024-01-28 MED ORDER — ROCURONIUM BROMIDE 10 MG/ML (PF) SYRINGE
PREFILLED_SYRINGE | INTRAVENOUS | Status: AC
  Filled 2024-01-28: qty 10

## 2024-01-28 MED ORDER — BUPIVACAINE-EPINEPHRINE (PF) 0.25% -1:200000 IJ SOLN
INTRAMUSCULAR | Status: AC
  Filled 2024-01-28: qty 30

## 2024-01-28 MED ORDER — BUPIVACAINE HCL (PF) 0.25 % IJ SOLN
INTRAMUSCULAR | Status: AC
  Filled 2024-01-28: qty 30

## 2024-01-28 MED ORDER — ONDANSETRON HCL 4 MG/2ML IJ SOLN
INTRAMUSCULAR | Status: DC | PRN
  Administered 2024-01-28: 4 mg via INTRAVENOUS

## 2024-01-28 MED ORDER — FENTANYL CITRATE (PF) 100 MCG/2ML IJ SOLN
INTRAMUSCULAR | Status: AC
  Filled 2024-01-28: qty 2

## 2024-01-28 MED ORDER — BUPIVACAINE HCL 0.25 % IJ SOLN
INTRAMUSCULAR | Status: DC | PRN
  Administered 2024-01-28: 10 mL

## 2024-01-28 MED ORDER — ACETAMINOPHEN 500 MG PO TABS: 1000.0000 mg | ORAL_TABLET | Freq: Once | ORAL | Status: DC | PRN

## 2024-01-28 MED ORDER — CHLORHEXIDINE GLUCONATE 0.12 % MT SOLN
15.0000 mL | Freq: Once | OROMUCOSAL | Status: AC
  Administered 2024-01-28: 15 mL via OROMUCOSAL

## 2024-01-28 MED ORDER — FENTANYL CITRATE PF 50 MCG/ML IJ SOSY: 25.0000 ug | PREFILLED_SYRINGE | INTRAMUSCULAR | Status: DC | PRN

## 2024-01-28 MED ORDER — TRAMADOL HCL 50 MG PO TABS
50.0000 mg | ORAL_TABLET | Freq: Four times a day (QID) | ORAL | 0 refills | Status: AC | PRN
Start: 2024-01-28 — End: ?

## 2024-01-28 MED ORDER — NALOXONE HCL 0.4 MG/ML IJ SOLN
INTRAMUSCULAR | Status: AC
  Filled 2024-01-28: qty 1

## 2024-01-28 MED ORDER — LACTATED RINGERS IV SOLN: INTRAVENOUS | Status: DC

## 2024-01-28 MED ORDER — DEXAMETHASONE SODIUM PHOSPHATE 10 MG/ML IJ SOLN
INTRAMUSCULAR | Status: DC | PRN
  Administered 2024-01-28: 10 mg via INTRAVENOUS

## 2024-01-28 MED ORDER — PHENYLEPHRINE 80 MCG/ML (10ML) SYRINGE FOR IV PUSH (FOR BLOOD PRESSURE SUPPORT)
PREFILLED_SYRINGE | INTRAVENOUS | Status: DC | PRN
  Administered 2024-01-28: 160 ug via INTRAVENOUS

## 2024-01-28 MED ORDER — NALOXONE HCL 0.4 MG/ML IJ SOLN
INTRAMUSCULAR | Status: DC | PRN
  Administered 2024-01-28: .04 mg via INTRAVENOUS

## 2024-01-28 MED ORDER — OXYCODONE HCL 5 MG PO TABS: 5.0000 mg | ORAL_TABLET | Freq: Once | ORAL | Status: DC | PRN

## 2024-01-28 MED ORDER — ROCURONIUM BROMIDE 10 MG/ML (PF) SYRINGE
PREFILLED_SYRINGE | INTRAVENOUS | Status: DC | PRN
  Administered 2024-01-28: 50 mg via INTRAVENOUS

## 2024-01-28 MED ORDER — SUGAMMADEX SODIUM 200 MG/2ML IV SOLN
INTRAVENOUS | Status: DC | PRN
  Administered 2024-01-28: 200 mg via INTRAVENOUS

## 2024-01-28 MED ORDER — MIDAZOLAM HCL 2 MG/2ML IJ SOLN
INTRAMUSCULAR | Status: AC
  Filled 2024-01-28: qty 2

## 2024-01-28 MED ORDER — FENTANYL CITRATE (PF) 100 MCG/2ML IJ SOLN
INTRAMUSCULAR | Status: DC | PRN
  Administered 2024-01-28 (×2): 50 ug via INTRAVENOUS

## 2024-01-28 SURGICAL SUPPLY — 30 items
ATTRACTOMAT 16X20 MAGNETIC DRP (DRAPES) ×1 IMPLANT
BAG COUNTER SPONGE SURGICOUNT (BAG) ×1 IMPLANT
BLADE SURG 15 STRL LF DISP TIS (BLADE) ×1 IMPLANT
CHLORAPREP W/TINT 26 (MISCELLANEOUS) ×1 IMPLANT
CLIP TI MEDIUM 6 (CLIP) ×2 IMPLANT
CLIP TI WIDE RED SMALL 6 (CLIP) ×2 IMPLANT
COVER SURGICAL LIGHT HANDLE (MISCELLANEOUS) ×1 IMPLANT
DERMABOND ADVANCED .7 DNX12 (GAUZE/BANDAGES/DRESSINGS) ×1 IMPLANT
DERMABOND ADVANCED .7 DNX6 (GAUZE/BANDAGES/DRESSINGS) IMPLANT
DRAPE LAPAROTOMY T 98X78 PEDS (DRAPES) ×1 IMPLANT
DRAPE UTILITY XL STRL (DRAPES) ×1 IMPLANT
ELECT REM PT RETURN 15FT ADLT (MISCELLANEOUS) ×1 IMPLANT
GAUZE 4X4 16PLY ~~LOC~~+RFID DBL (SPONGE) ×1 IMPLANT
GLOVE SURG ORTHO 8.0 STRL STRW (GLOVE) ×1 IMPLANT
GOWN STRL REUS W/ TWL XL LVL3 (GOWN DISPOSABLE) ×3 IMPLANT
HEMOSTAT SURGICEL 2X4 FIBR (HEMOSTASIS) ×1 IMPLANT
ILLUMINATOR WAVEGUIDE N/F (MISCELLANEOUS) IMPLANT
KIT BASIN OR (CUSTOM PROCEDURE TRAY) ×1 IMPLANT
KIT TURNOVER KIT A (KITS) IMPLANT
NDL HYPO 22X1.5 SAFETY MO (MISCELLANEOUS) ×1 IMPLANT
NEEDLE HYPO 22X1.5 SAFETY MO (MISCELLANEOUS) ×1 IMPLANT
PACK BASIC VI WITH GOWN DISP (CUSTOM PROCEDURE TRAY) ×1 IMPLANT
PENCIL SMOKE EVACUATOR (MISCELLANEOUS) ×1 IMPLANT
SHEARS HARMONIC 9CM CVD (BLADE) IMPLANT
SUT MNCRL AB 4-0 PS2 18 (SUTURE) ×1 IMPLANT
SUT VIC AB 3-0 SH 18 (SUTURE) ×1 IMPLANT
SYR BULB IRRIG 60ML STRL (SYRINGE) ×1 IMPLANT
SYR CONTROL 10ML LL (SYRINGE) ×1 IMPLANT
TOWEL OR 17X26 10 PK STRL BLUE (TOWEL DISPOSABLE) ×1 IMPLANT
TUBING CONNECTING 10 (TUBING) ×1 IMPLANT

## 2024-01-28 NOTE — Anesthesia Procedure Notes (Signed)
 Procedure Name: Intubation Date/Time: 01/28/2024 12:02 PM  Performed by: Basilio Cairo, CRNAPre-anesthesia Checklist: Patient identified, Patient being monitored, Timeout performed, Emergency Drugs available and Suction available Patient Re-evaluated:Patient Re-evaluated prior to induction Oxygen Delivery Method: Circle system utilized Preoxygenation: Pre-oxygenation with 100% oxygen Induction Type: IV induction Ventilation: Mask ventilation without difficulty Laryngoscope Size: Mac and 3 Grade View: Grade I Tube type: Oral Tube size: 7.0 mm Number of attempts: 1 Airway Equipment and Method: Stylet Placement Confirmation: ETT inserted through vocal cords under direct vision, positive ETCO2 and breath sounds checked- equal and bilateral Secured at: 21 cm Tube secured with: Tape Dental Injury: Teeth and Oropharynx as per pre-operative assessment

## 2024-01-28 NOTE — Transfer of Care (Signed)
 Immediate Anesthesia Transfer of Care Note  Patient: Deanna [name]  Procedure(s) Performed: Procedure(s) with comments: PARATHYROIDECTOMY (Left) - LEFT INFERIOR PARATHYROIDOECTOMY  Patient Location: PACU  Anesthesia Type:General  Level of Consciousness: Alert, Awake, Oriented  Airway & Oxygen Therapy: Patient Spontanous Breathing  Post-op Assessment: Report given to RN  Post vital signs: Reviewed and stable  Last Vitals:  Vitals:   01/28/24 0834  BP: (!) 197/91  Pulse: 67  Resp: 16  Temp: 36.7 C  SpO2: 98%    Complications: No apparent anesthesia complications

## 2024-01-28 NOTE — Interval H&P Note (Signed)
 History and Physical Interval Note:  01/28/2024 11:20 AM  Deanna [name]  has presented today for surgery, with the diagnosis of Primary hyperparathyroidism.  The various methods of treatment have been discussed with the patient and family. After consideration of risks, benefits and other options for treatment, the patient has consented to    Procedure(s) with comments: PARATHYROIDECTOMY (Left) - LEFT INFERIOR PARATHYROIDOECTOMY as a surgical intervention.    The patient's history has been reviewed, patient examined, no change in status, stable for surgery.  I have reviewed the patient's chart and labs.  Questions were answered to the patient's satisfaction.    Darnell Level, MD [facility] A [facility] practice Office: [phone]   Darnell Level

## 2024-01-28 NOTE — Discharge Instructions (Signed)

## 2024-01-28 NOTE — Op Note (Signed)
 OPERATIVE REPORT - PARATHYROIDECTOMY  Preoperative diagnosis: Primary hyperparathyroidism  Postop diagnosis: Same  Procedure: Left inferior minimally invasive parathyroidectomy  Surgeon:  Darnell Level, MD  Anesthesia: General endotracheal  Estimated blood loss: Minimal  Preparation: ChloraPrep  Indications: Patient is referred by Inez Pilgrim, NP, for surgical evaluation and management of newly diagnosed primary hyperparathyroidism. Patient had been noted on routine laboratory studies to have an elevated serum calcium level at 11.3. Intact PTH level was elevated at 76. Patient has not had any imaging studies. Patient does have a history of osteoporosis. She is on alendronate. Patient notes fatigue. She notes issues with memory although she is being treated for early dementia. She has had a history of depression. She complains of bone and joint pain. She notes urinary frequency. She has had some problems with constipation. Patient has had no prior head or neck surgery. USN and CT scan localize a left inferior adenoma.  Patient now comes to surgery for parathyroidectomy.  Procedure: The patient was prepared in the pre-operative holding area. The patient was brought to the operating room and placed in a supine position on the operating room table. Following administration of general anesthesia, the patient was positioned and then prepped and draped in the usual strict aseptic fashion. After ascertaining that an adequate level of anesthesia been achieved, a neck incision was made with a #15 blade. Dissection was carried through subcutaneous tissues and platysma. Hemostasis was obtained with the electrocautery. Skin flaps were developed circumferentially and a Weitlander retractor was placed for exposure.  Strap muscles were incised in the midline. Strap muscles were reflected laterally exposing the thyroid lobe. With gentle blunt dissection the thyroid lobe was mobilized.  Dissection was carried  posteriorly and an enlarged parathyroid gland was identified. It was gently mobilized. Vascular structures were divided between small ligaclips. Care was taken to avoid the recurrent laryngeal nerve. The parathyroid gland was completely excised. It was submitted to pathology where frozen section confirmed hypercellular parathyroid tissue consistent with adenoma.  Neck was irrigated with warm saline and good hemostasis was noted. Fibrillar was placed in the operative field. Strap muscles were approximated in the midline with interrupted 3-0 Vicryl sutures. Platysma was closed with interrupted 3-0 Vicryl sutures. Marcaine was infiltrated circumferentially. Skin was closed with a running 4-0 Monocryl subcuticular suture. Wound was washed and dried and Dermabond was applied. Patient was awakened from anesthesia and brought to the recovery room. The patient tolerated the procedure well.   Darnell Level, MD [facility] Surgery Office: [phone]

## 2024-01-28 NOTE — Anesthesia Preprocedure Evaluation (Addendum)
 Anesthesia Evaluation  Patient identified by MRN, date of birth, ID band Patient awake    Reviewed: Allergy & Precautions, NPO status , Patient's Chart, lab work & pertinent test results  History of Anesthesia Complications Negative for: history of anesthetic complications  Airway Mallampati: II  TM Distance: >3 FB Neck ROM: Full    Dental  (+) Teeth Intact, Dental Advisory Given   Pulmonary neg pulmonary ROS, neg shortness of breath, neg sleep apnea, neg COPD, neg recent URI, former smoker   breath sounds clear to auscultation       Cardiovascular hypertension, Pt. on medications (-) angina (-) Past MI and (-) CHF  Rhythm:Regular     Neuro/Psych  Headaches    GI/Hepatic negative GI ROS, Neg liver ROS,,,  Endo/Other  Lab Results      Component                Value               Date                      NA                       141                 01/27/2024                K                        3.8                 01/27/2024                CO2                      28                  01/27/2024                GLUCOSE                  93                  01/27/2024                BUN                      15                  01/27/2024                CREATININE               0.99                01/27/2024                CALCIUM                  10.9 (H)            01/27/2024                GFRNONAA                 >60                 01/27/2024  Primary hyperparathyroidism  Renal/GU negative Renal ROS     Musculoskeletal negative musculoskeletal ROS (+)    Abdominal   Peds  Hematology negative hematology ROS (+)   Anesthesia Other Findings   Reproductive/Obstetrics                             Anesthesia Physical Anesthesia Plan  ASA: 2  Anesthesia Plan: General   Post-op Pain Management:    Induction: Intravenous  PONV Risk Score and Plan: 4 or greater and  Ondansetron and Dexamethasone  Airway Management Planned: Oral ETT  Additional Equipment: None  Intra-op Plan:   Post-operative Plan: Extubation in OR  Informed Consent: I have reviewed the patients History and Physical, chart, labs and discussed the procedure including the risks, benefits and alternatives for the proposed anesthesia with the patient or authorized representative who has indicated his/her understanding and acceptance.     Dental advisory given  Plan Discussed with: CRNA  Anesthesia Plan Comments:        Anesthesia Quick Evaluation

## 2024-01-29 ENCOUNTER — Encounter (HOSPITAL_COMMUNITY): Payer: Self-pay | Admitting: Surgery

## 2024-01-29 LAB — SURGICAL PATHOLOGY

## 2024-01-29 NOTE — Anesthesia Postprocedure Evaluation (Signed)
 Anesthesia Post Note  Patient: Deanna [name]  Procedure(s) Performed: PARATHYROIDECTOMY (Left)     Patient location during evaluation: PACU Anesthesia Type: General Level of consciousness: awake and alert Pain management: pain level controlled Vital Signs Assessment: post-procedure vital signs reviewed and stable Respiratory status: spontaneous breathing, nonlabored ventilation and respiratory function stable Cardiovascular status: blood pressure returned to baseline and stable Postop Assessment: no apparent nausea or vomiting Anesthetic complications: no   No notable events documented.                Duc Crocket

## 2024-02-11 DIAGNOSIS — E210 Primary hyperparathyroidism: Secondary | ICD-10-CM | POA: Diagnosis not present

## 2024-02-11 DIAGNOSIS — Z9889 Other specified postprocedural states: Secondary | ICD-10-CM | POA: Diagnosis not present

## 2024-02-11 DIAGNOSIS — Z9089 Acquired absence of other organs: Secondary | ICD-10-CM | POA: Diagnosis not present

## 2024-02-25 ENCOUNTER — Ambulatory Visit (HOSPITAL_COMMUNITY)
Admission: RE | Admit: 2024-02-25 | Discharge: 2024-02-25 | Disposition: A | Discharge status: Home / Self Care | Source: Ambulatory Visit | Attending: Family Medicine | Admitting: Family Medicine

## 2024-02-25 DIAGNOSIS — R1312 Dysphagia, oropharyngeal phase: Secondary | ICD-10-CM | POA: Insufficient documentation

## 2024-02-25 DIAGNOSIS — Z03822 Encounter for observation for suspected aspirated (inhaled) foreign body ruled out: Secondary | ICD-10-CM | POA: Diagnosis not present

## 2024-02-25 DIAGNOSIS — R1310 Dysphagia, unspecified: Secondary | ICD-10-CM

## 2024-02-25 NOTE — Progress Notes (Signed)
 Modified Barium Swallow Study  Patient Details  Name: Deanna [name] MRN: [id number] Date of Birth: [birth year]  Today's Date: 02/25/2024  Modified Barium Swallow completed.  Full report located under Chart Review in the Imaging Section.  History of Present Illness Deanna [name] is a 73 y.o.f. who was referred by OP SLP for an MBS.  Per notes, pt "expressed concerns regarding globus sensation, and feeling of 'things getting stuck' or not going all the way down when swallowing. Pt shared this occurs with both liquids and solids, but reports more trouble with dry foods. 3 oz water challenge completed with no overt s/sx aspiration. Pt is in relatively good health with intact natural dentition, endorsing daily oral care. Suggest ongoing current diet with esophageal precautions of bolus size modification, increased liquids during PO intake, small meals, upright positioning. Pt confirmed upcoming parathyroidectomy to address other health concerns and potential esophageal issues. Pt expressed desire to complete surgery prior to completing a swallow study and initiating speech therapy."   Pt underwent minimally invasive parathyroidecomy on 01/28/24. She reports reduced globus sensation since procedure. Her husband accompanied her today to exam.   Clinical Impression Pt presents with a normal oropharyngeal swallow. There was thorough mastication and normal propulsion into and through the pharynx.  Laryngeal vestibule closure was reliable with no penetration/aspiration.  (Note: there was calcification of posterior larynx visible prior to barium administration - not to be confused with barium in airway/larynx.) Pt demonstrated adequate pharyngeal squeeze and base-of-tongue contraction in order to transfer material though UES.  Proximal UES appeared to be tight - there was presence of a CP bar that was visible between the contour of tissue that was aligned with cervical osteophytes.  CP bar resulted in barium  being squeezed superiorly through UES and occasionally trace amounts would land back in the pyriform sinuses. This feature may contribute to pt's sensation of globus and explain occasional difficulty swallowing tough meats.  A 13 mm barium pill traversed the esophagus with momentary hesitation then successful clearance.  Pt may benefit from esophageal w/u to determine benefit of upper esophageal dilation.  Continue regular solids, thin liquids as recommended by referring SLP. Discussed results/ recs wit pt and her spouse.    Factors that may increase risk of adverse event in presence of aspiration Deanna [name] & Deanna [name] 2021):  N/A    Swallow Evaluation Recommendations Recommendations: PO diet PO Diet Recommendation: Regular;Thin liquids (Level 0) Liquid Administration via: Cup;Straw Medication Administration: Whole meds with liquid Supervision: Patient able to self-feed Postural changes: Stay upright 30-60 min after meals;Position pt fully upright for meals Oral care recommendations: Oral care BID (2x/day) Recommended consults: Consider ENT consultation (Dr. Artice [name])     Deanna Loft. Beatris Lincoln, MA CCC/SLP Clinical Specialist - Acute Care SLP Acute Rehabilitation Services Office number [phone]  Deanna [name] 02/25/2024,11:39 AM

## 2024-03-23 ENCOUNTER — Other Ambulatory Visit: Payer: Self-pay | Admitting: Neurology

## 2024-04-06 DIAGNOSIS — I10 Essential (primary) hypertension: Secondary | ICD-10-CM | POA: Diagnosis not present

## 2024-04-06 DIAGNOSIS — N189 Chronic kidney disease, unspecified: Secondary | ICD-10-CM | POA: Diagnosis not present

## 2024-04-06 DIAGNOSIS — N1831 Chronic kidney disease, stage 3a: Secondary | ICD-10-CM | POA: Diagnosis not present

## 2024-04-09 DIAGNOSIS — I10 Essential (primary) hypertension: Secondary | ICD-10-CM | POA: Diagnosis not present

## 2024-04-09 DIAGNOSIS — N189 Chronic kidney disease, unspecified: Secondary | ICD-10-CM | POA: Diagnosis not present

## 2024-04-09 DIAGNOSIS — M810 Age-related osteoporosis without current pathological fracture: Secondary | ICD-10-CM | POA: Diagnosis not present

## 2024-04-09 DIAGNOSIS — G309 Alzheimer's disease, unspecified: Secondary | ICD-10-CM | POA: Diagnosis not present

## 2024-04-15 DIAGNOSIS — N1831 Chronic kidney disease, stage 3a: Secondary | ICD-10-CM | POA: Diagnosis not present

## 2024-04-15 DIAGNOSIS — I10 Essential (primary) hypertension: Secondary | ICD-10-CM | POA: Diagnosis not present

## 2024-04-15 DIAGNOSIS — E210 Primary hyperparathyroidism: Secondary | ICD-10-CM | POA: Diagnosis not present

## 2024-04-27 DIAGNOSIS — N1831 Chronic kidney disease, stage 3a: Secondary | ICD-10-CM | POA: Diagnosis not present

## 2024-04-27 DIAGNOSIS — R809 Proteinuria, unspecified: Secondary | ICD-10-CM | POA: Diagnosis not present

## 2024-04-27 DIAGNOSIS — I10 Essential (primary) hypertension: Secondary | ICD-10-CM | POA: Diagnosis not present

## 2024-05-05 DIAGNOSIS — N1831 Chronic kidney disease, stage 3a: Secondary | ICD-10-CM | POA: Diagnosis not present

## 2024-05-05 DIAGNOSIS — I10 Essential (primary) hypertension: Secondary | ICD-10-CM | POA: Diagnosis not present

## 2024-05-05 DIAGNOSIS — N189 Chronic kidney disease, unspecified: Secondary | ICD-10-CM | POA: Diagnosis not present

## 2024-05-09 DIAGNOSIS — N189 Chronic kidney disease, unspecified: Secondary | ICD-10-CM | POA: Diagnosis not present

## 2024-05-09 DIAGNOSIS — G309 Alzheimer's disease, unspecified: Secondary | ICD-10-CM | POA: Diagnosis not present

## 2024-05-09 DIAGNOSIS — N1831 Chronic kidney disease, stage 3a: Secondary | ICD-10-CM | POA: Diagnosis not present

## 2024-05-09 DIAGNOSIS — M810 Age-related osteoporosis without current pathological fracture: Secondary | ICD-10-CM | POA: Diagnosis not present

## 2024-05-09 DIAGNOSIS — I10 Essential (primary) hypertension: Secondary | ICD-10-CM | POA: Diagnosis not present

## 2024-06-04 DIAGNOSIS — N189 Chronic kidney disease, unspecified: Secondary | ICD-10-CM | POA: Diagnosis not present

## 2024-06-04 DIAGNOSIS — I10 Essential (primary) hypertension: Secondary | ICD-10-CM | POA: Diagnosis not present

## 2024-06-04 DIAGNOSIS — N1831 Chronic kidney disease, stage 3a: Secondary | ICD-10-CM | POA: Diagnosis not present

## 2024-06-09 DIAGNOSIS — M810 Age-related osteoporosis without current pathological fracture: Secondary | ICD-10-CM | POA: Diagnosis not present

## 2024-06-09 DIAGNOSIS — I10 Essential (primary) hypertension: Secondary | ICD-10-CM | POA: Diagnosis not present

## 2024-06-09 DIAGNOSIS — N1831 Chronic kidney disease, stage 3a: Secondary | ICD-10-CM | POA: Diagnosis not present

## 2024-06-09 DIAGNOSIS — G309 Alzheimer's disease, unspecified: Secondary | ICD-10-CM | POA: Diagnosis not present

## 2024-06-09 DIAGNOSIS — N189 Chronic kidney disease, unspecified: Secondary | ICD-10-CM | POA: Diagnosis not present

## 2024-06-21 ENCOUNTER — Other Ambulatory Visit: Payer: Self-pay | Admitting: Neurology

## 2024-07-04 DIAGNOSIS — I10 Essential (primary) hypertension: Secondary | ICD-10-CM | POA: Diagnosis not present

## 2024-07-04 DIAGNOSIS — N189 Chronic kidney disease, unspecified: Secondary | ICD-10-CM | POA: Diagnosis not present

## 2024-07-04 DIAGNOSIS — N1831 Chronic kidney disease, stage 3a: Secondary | ICD-10-CM | POA: Diagnosis not present

## 2024-07-07 DIAGNOSIS — Z09 Encounter for follow-up examination after completed treatment for conditions other than malignant neoplasm: Secondary | ICD-10-CM | POA: Diagnosis not present

## 2024-07-07 DIAGNOSIS — K6389 Other specified diseases of intestine: Secondary | ICD-10-CM | POA: Diagnosis not present

## 2024-07-07 DIAGNOSIS — K635 Polyp of colon: Secondary | ICD-10-CM | POA: Diagnosis not present

## 2024-07-07 DIAGNOSIS — Z83719 Family history of colon polyps, unspecified: Secondary | ICD-10-CM | POA: Diagnosis not present

## 2024-07-07 DIAGNOSIS — D175 Benign lipomatous neoplasm of intra-abdominal organs: Secondary | ICD-10-CM | POA: Diagnosis not present

## 2024-07-07 DIAGNOSIS — Z860100 Personal history of colon polyps, unspecified: Secondary | ICD-10-CM | POA: Diagnosis not present

## 2024-07-10 DIAGNOSIS — I10 Essential (primary) hypertension: Secondary | ICD-10-CM | POA: Diagnosis not present

## 2024-07-10 DIAGNOSIS — N189 Chronic kidney disease, unspecified: Secondary | ICD-10-CM | POA: Diagnosis not present

## 2024-07-10 DIAGNOSIS — G309 Alzheimer's disease, unspecified: Secondary | ICD-10-CM | POA: Diagnosis not present

## 2024-07-10 DIAGNOSIS — N1831 Chronic kidney disease, stage 3a: Secondary | ICD-10-CM | POA: Diagnosis not present

## 2024-07-10 DIAGNOSIS — M810 Age-related osteoporosis without current pathological fracture: Secondary | ICD-10-CM | POA: Diagnosis not present

## 2024-07-12 DIAGNOSIS — K635 Polyp of colon: Secondary | ICD-10-CM | POA: Diagnosis not present

## 2024-08-03 DIAGNOSIS — N1831 Chronic kidney disease, stage 3a: Secondary | ICD-10-CM | POA: Diagnosis not present

## 2024-08-03 DIAGNOSIS — N189 Chronic kidney disease, unspecified: Secondary | ICD-10-CM | POA: Diagnosis not present

## 2024-08-03 DIAGNOSIS — I10 Essential (primary) hypertension: Secondary | ICD-10-CM | POA: Diagnosis not present

## 2024-08-05 DIAGNOSIS — E782 Mixed hyperlipidemia: Secondary | ICD-10-CM | POA: Diagnosis not present

## 2024-08-05 DIAGNOSIS — Z23 Encounter for immunization: Secondary | ICD-10-CM | POA: Diagnosis not present

## 2024-08-05 DIAGNOSIS — I10 Essential (primary) hypertension: Secondary | ICD-10-CM | POA: Diagnosis not present

## 2024-08-05 DIAGNOSIS — G309 Alzheimer's disease, unspecified: Secondary | ICD-10-CM | POA: Diagnosis not present

## 2024-08-05 DIAGNOSIS — Z0000 Encounter for general adult medical examination without abnormal findings: Secondary | ICD-10-CM | POA: Diagnosis not present

## 2024-08-09 DIAGNOSIS — N1831 Chronic kidney disease, stage 3a: Secondary | ICD-10-CM | POA: Diagnosis not present

## 2024-08-09 DIAGNOSIS — G309 Alzheimer's disease, unspecified: Secondary | ICD-10-CM | POA: Diagnosis not present

## 2024-08-09 DIAGNOSIS — N189 Chronic kidney disease, unspecified: Secondary | ICD-10-CM | POA: Diagnosis not present

## 2024-08-09 DIAGNOSIS — M810 Age-related osteoporosis without current pathological fracture: Secondary | ICD-10-CM | POA: Diagnosis not present

## 2024-08-09 DIAGNOSIS — I10 Essential (primary) hypertension: Secondary | ICD-10-CM | POA: Diagnosis not present

## 2024-08-29 DIAGNOSIS — Z1231 Encounter for screening mammogram for malignant neoplasm of breast: Secondary | ICD-10-CM | POA: Diagnosis not present

## 2024-08-29 DIAGNOSIS — Z1331 Encounter for screening for depression: Secondary | ICD-10-CM | POA: Diagnosis not present

## 2024-08-29 DIAGNOSIS — Z23 Encounter for immunization: Secondary | ICD-10-CM | POA: Diagnosis not present

## 2024-08-29 DIAGNOSIS — Z0000 Encounter for general adult medical examination without abnormal findings: Secondary | ICD-10-CM | POA: Diagnosis not present

## 2024-09-02 DIAGNOSIS — N189 Chronic kidney disease, unspecified: Secondary | ICD-10-CM | POA: Diagnosis not present

## 2024-09-02 DIAGNOSIS — N1831 Chronic kidney disease, stage 3a: Secondary | ICD-10-CM | POA: Diagnosis not present

## 2024-09-02 DIAGNOSIS — I10 Essential (primary) hypertension: Secondary | ICD-10-CM | POA: Diagnosis not present

## 2024-09-09 DIAGNOSIS — N189 Chronic kidney disease, unspecified: Secondary | ICD-10-CM | POA: Diagnosis not present

## 2024-09-09 DIAGNOSIS — G309 Alzheimer's disease, unspecified: Secondary | ICD-10-CM | POA: Diagnosis not present

## 2024-09-09 DIAGNOSIS — N1831 Chronic kidney disease, stage 3a: Secondary | ICD-10-CM | POA: Diagnosis not present

## 2024-09-09 DIAGNOSIS — M810 Age-related osteoporosis without current pathological fracture: Secondary | ICD-10-CM | POA: Diagnosis not present

## 2024-09-09 DIAGNOSIS — I10 Essential (primary) hypertension: Secondary | ICD-10-CM | POA: Diagnosis not present

## 2024-09-14 NOTE — Progress Notes (Unsigned)
 NEUROLOGY FOLLOW UP OFFICE NOTE  Deanna [name] [id number]  Assessment/Plan:   Major neurocognitive disorder secondary to Alzheimer's disease Hypertension     1.  Dementia management:  donepezil  10mg  at bedtime, memantine  10mg  twice daily 2.  Socialization 4.  Exercise 5.  Refer to [facility] for potential eligibility of current memory care studies and treatment options. 6.  Advised to contact PCP immediately or go to ED 7.  Follow up 9 months.   Subjective:  Deanna [name] is a 73 year old right-handed female with mitral valve prolapse and hyperparathyroidism who follows up for migraines.   UPDATE: On donepezil  10mg  at bedtime and memantine  10mg  twice daily.  Last visit, she was referred again to [facility] for potential eligibility of current memory care studies and treatment options.  ***  Husband recognizes short term memory worse.  Forgets watching recent TV show or what she ate for dinner a couple of nights ago.  May not remember going to certain restaurants.  Still with trouble with remembering names and word-finding difficulty.  She no longer is driving anymore.  She started struggling with remembering where the windshield wipers are.  Once she left the stove on, so she no longer cooks other than the microwave.  Long-term memory intact.  She frequently has dreams but doesn't remember what they were about.  Appetite varies.  Overall has maintained her weight.    Looking into moving to [facility] or [employer].      HISTORY: Her family and friends were concerned about her memory problems since 2022.  Problems with recall and word finding difficulty.  Concern because her mother had Alzheimer's disease and her brother has Alzheimer's disease.  Underwent neuropsychological evaluation on 01/14/2022 which revealed impairment involving executive functioning, receptive and expressive language, and verbal aspects of learning and memory meeting criteria of mild  neurocognitive disorder but of unclear etiology but a language variant FTD was considered.  Serum labs from 04/14/2021 revealed B12 857 and TSH 2.22.  MRI of brain without contrast on 04/30/2022  showed mild chronic small vessel ischemic changes but otherwise unremarkable.  PET scan of brain on 05/21/2022 was within normal limits.  Underwent LP on 05/29/2022 which revealed CSF elevated T-Tau protein of 2,511 and 14-3-3 11,286.  Paraneoplastic panel negative, cell count 0, protein 38, glucose 58, negative VDRL, negative hepatitis E IgG, negative HSV 1/2 DNA, negative IgG synthesis/index, negative oligoclonal bands, negative culture, negative fungal culture, negative cryptococcal antigen.       PAST MEDICAL HISTORY: Past Medical History:  Diagnosis Date   Decreased estrogen level    Dementia (HCC)    Dysphagia    Heart murmur    Hypertension    Migraine headache    Mild cognitive impairment of uncertain or unknown etiology 01/14/2022   Mixed hyperlipidemia    Osteoporosis    Personal history of colonic polyps    Varicose veins of bilateral lower extremities with other complications     MEDICATIONS: Current Outpatient Medications on File Prior to Visit  Medication Sig Dispense Refill   alendronate (FOSAMAX) 70 MG tablet Take 70 mg by mouth once a week.     atorvastatin (LIPITOR) 10 MG tablet Take 10 mg by mouth daily.     donepezil  (ARICEPT ) 10 MG tablet TAKE 1 TABLET BY MOUTH EVERYDAY AT BEDTIME 90 tablet 0   lisinopril (ZESTRIL) 20 MG tablet Take 20 mg by mouth daily.     memantine  (NAMENDA ) 10 MG tablet TAKE 1 TABLET  BY MOUTH TWICE A DAY 180 tablet 3   traMADol  (ULTRAM ) 50 MG tablet Take 1 tablet (50 mg total) by mouth every 6 (six) hours as needed for moderate pain (pain score 4-6). 12 tablet 0   No current facility-administered medications on file prior to visit.    ALLERGIES: Allergies  Allergen Reactions   Sulfa Antibiotics     Does not remember reaction     FAMILY  HISTORY: Family History  Problem Relation Age of Onset   Dementia Mother    Alzheimer's disease Mother    Heart attack Father 55   Alzheimer's disease Brother        onset in early 55s   Benign prostatic hyperplasia Brother    CVA Maternal Aunt    Dementia Maternal Uncle       Objective:  *** General: No acute distress.  Patient appears well-groomed.   Head:  Normocephalic/atraumatic Eyes:  Fundi examined but not visualized Neck: supple, no paraspinal tenderness, full range of motion Heart:  Regular rate and rhythm Lungs:  Clear to auscultation bilaterally Back: No paraspinal tenderness Neurological Exam: ***   Juliene Dunnings, DO  CC: Neale Carpen, NP

## 2024-09-15 ENCOUNTER — Ambulatory Visit: Payer: PPO | Admitting: Neurology

## 2024-09-15 ENCOUNTER — Encounter: Payer: Self-pay | Admitting: Neurology

## 2024-09-15 VITALS — BP 127/64 | HR 58 | Ht 61.0 in | Wt 94.0 lb

## 2024-09-15 DIAGNOSIS — G309 Alzheimer's disease, unspecified: Secondary | ICD-10-CM | POA: Diagnosis not present

## 2024-09-15 DIAGNOSIS — F0280 Dementia in other diseases classified elsewhere without behavioral disturbance: Secondary | ICD-10-CM

## 2024-09-15 MED ORDER — DONEPEZIL HCL 10 MG PO TABS: 10.0000 mg | ORAL_TABLET | Freq: Every day | ORAL | 3 refills | Status: AC

## 2024-09-15 MED ORDER — MEMANTINE HCL 10 MG PO TABS: 10.0000 mg | ORAL_TABLET | Freq: Two times a day (BID) | ORAL | 3 refills | Status: AC

## 2024-09-15 NOTE — Patient Instructions (Signed)
 Donepezil  10mg  at bedtime and memantine  10mg  twice daily Continue socialization/activity Exercise/walking

## 2024-10-02 DIAGNOSIS — I10 Essential (primary) hypertension: Secondary | ICD-10-CM | POA: Diagnosis not present

## 2024-10-02 DIAGNOSIS — N1831 Chronic kidney disease, stage 3a: Secondary | ICD-10-CM | POA: Diagnosis not present

## 2024-10-02 DIAGNOSIS — N189 Chronic kidney disease, unspecified: Secondary | ICD-10-CM | POA: Diagnosis not present

## 2024-10-09 DIAGNOSIS — G309 Alzheimer's disease, unspecified: Secondary | ICD-10-CM | POA: Diagnosis not present

## 2024-10-09 DIAGNOSIS — I10 Essential (primary) hypertension: Secondary | ICD-10-CM | POA: Diagnosis not present

## 2024-10-09 DIAGNOSIS — N189 Chronic kidney disease, unspecified: Secondary | ICD-10-CM | POA: Diagnosis not present

## 2024-10-09 DIAGNOSIS — N1831 Chronic kidney disease, stage 3a: Secondary | ICD-10-CM | POA: Diagnosis not present

## 2024-10-09 DIAGNOSIS — M810 Age-related osteoporosis without current pathological fracture: Secondary | ICD-10-CM | POA: Diagnosis not present

## 2024-12-08 ENCOUNTER — Other Ambulatory Visit: Payer: Self-pay

## 2024-12-08 ENCOUNTER — Ambulatory Visit
Admission: EM | Admit: 2024-12-08 | Discharge: 2024-12-08 | Disposition: A | Discharge status: Home / Self Care | Attending: Emergency Medicine | Admitting: Emergency Medicine

## 2024-12-08 ENCOUNTER — Emergency Department
Admission: EM | Admit: 2024-12-08 | Discharge: 2024-12-08 | Disposition: A | Discharge status: Home / Self Care | Attending: Emergency Medicine | Admitting: Emergency Medicine

## 2024-12-08 ENCOUNTER — Ambulatory Visit

## 2024-12-08 ENCOUNTER — Emergency Department

## 2024-12-08 DIAGNOSIS — S62102A Fracture of unspecified carpal bone, left wrist, initial encounter for closed fracture: Secondary | ICD-10-CM

## 2024-12-08 DIAGNOSIS — W000XXA Fall on same level due to ice and snow, initial encounter: Secondary | ICD-10-CM | POA: Diagnosis not present

## 2024-12-08 DIAGNOSIS — M810 Age-related osteoporosis without current pathological fracture: Secondary | ICD-10-CM | POA: Diagnosis not present

## 2024-12-08 DIAGNOSIS — S52122A Displaced fracture of head of left radius, initial encounter for closed fracture: Secondary | ICD-10-CM | POA: Diagnosis not present

## 2024-12-08 DIAGNOSIS — S52592A Other fractures of lower end of left radius, initial encounter for closed fracture: Secondary | ICD-10-CM

## 2024-12-08 DIAGNOSIS — S6992XA Unspecified injury of left wrist, hand and finger(s), initial encounter: Secondary | ICD-10-CM | POA: Diagnosis present

## 2024-12-08 MED ORDER — BUPIVACAINE HCL (PF) 0.5 % IJ SOLN
10.0000 mL | Freq: Once | INTRAMUSCULAR | Status: AC
  Administered 2024-12-08: 10 mL
  Filled 2024-12-08: qty 10

## 2024-12-08 MED ORDER — LIDOCAINE HCL (PF) 1 % IJ SOLN
5.0000 mL | Freq: Once | INTRAMUSCULAR | Status: AC
  Administered 2024-12-08: 5 mL
  Filled 2024-12-08: qty 5

## 2024-12-08 MED ORDER — OXYCODONE HCL 5 MG PO TABS
5.0000 mg | ORAL_TABLET | Freq: Once | ORAL | Status: AC
  Administered 2024-12-08: 5 mg via ORAL
  Filled 2024-12-08: qty 1

## 2024-12-08 NOTE — ED Triage Notes (Signed)
 Patient presents to UC for left arm injury. Patient slipped and fell today.

## 2024-12-08 NOTE — ED Provider Notes (Signed)
 " [facility]    CSN: [id number] Arrival date & time: 12/08/24  1656      History   Chief Complaint Chief Complaint  Patient presents with   Arm Injury    HPI Deanna [name] is a 74 y.o. female.  Accompanied by her husband, patient presents with left wrist pain after she slipped and fell today.  No open wounds, numbness, weakness.  No treatments at home.  The history is provided by the patient, the spouse and medical records.    Past Medical History:  Diagnosis Date   Decreased estrogen level    Dementia (HCC)    Dysphagia    Heart murmur    Hypertension    Migraine headache    Mild cognitive impairment of uncertain or unknown etiology 01/14/2022   Mixed hyperlipidemia    Osteoporosis    Personal history of colonic polyps    Varicose veins of bilateral lower extremities with other complications     Patient Active Problem List   Diagnosis Date Noted   Hyperparathyroidism, primary 01/24/2024   Mild cognitive impairment of uncertain or unknown etiology 01/14/2022   Decreased estrogen level    Hypertension    Migraine headache    Mixed hyperlipidemia    Osteoporosis    History of colonic polyps    Varicose veins of bilateral lower extremities with other complications     Past Surgical History:  Procedure Laterality Date   BREAST BIOPSY Left    PARATHYROIDECTOMY Left 01/28/2024   Procedure: PARATHYROIDECTOMY;  Surgeon: Eletha Boas, MD;  Location: [facility];  Service: General;  Laterality: Left;  LEFT INFERIOR PARATHYROIDOECTOMY   TEMPOROMANDIBULAR JOINT SURGERY Right 1990   TONSILLECTOMY     WISDOM TOOTH EXTRACTION      OB History   No obstetric history on file.      Home Medications    Prior to Admission medications  Medication Sig Start Date End Date Taking? Authorizing Provider  alendronate (FOSAMAX) 70 MG tablet Take 70 mg by mouth once a week. 12/12/21   [provider]  atorvastatin (LIPITOR) 10 MG tablet Take 10 mg by mouth daily.     [provider]  donepezil  (ARICEPT ) 10 MG tablet Take 1 tablet (10 mg total) by mouth at bedtime. 09/15/24   Skeet Juliene SAUNDERS, DO  hydrochlorothiazide (MICROZIDE) 12.5 MG capsule Take 12.5 mg by mouth every morning.    [provider]  lisinopril (ZESTRIL) 20 MG tablet Take 20 mg by mouth daily. 11/05/21   [provider]  lisinopril (ZESTRIL) 40 MG tablet Take 40 mg by mouth daily.    [provider]  memantine  (NAMENDA ) 10 MG tablet Take 1 tablet (10 mg total) by mouth 2 (two) times daily. 09/15/24   Skeet Juliene SAUNDERS, DO  traMADol  (ULTRAM ) 50 MG tablet Take 1 tablet (50 mg total) by mouth every 6 (six) hours as needed for moderate pain (pain score 4-6). 01/28/24   Eletha Boas, MD    Family History Family History  Problem Relation Age of Onset   Dementia Mother    Alzheimer's disease Mother    Heart attack Father 64   Alzheimer's disease Brother        onset in early 99s   Benign prostatic hyperplasia Brother    CVA Maternal Aunt    Dementia Maternal Uncle     Social History Social History[1]   Allergies   Sulfa antibiotics   Review of Systems Review of Systems  Musculoskeletal:  Positive for arthralgias and joint swelling.  Skin:  Negative for color change, rash and wound.  Neurological:  Negative for weakness and numbness.     Physical Exam Triage Vital Signs ED Triage Vitals [12/08/24 1759]  Encounter Vitals Group     BP 105/70     Girls Systolic BP Percentile      Girls Diastolic BP Percentile      Boys Systolic BP Percentile      Boys Diastolic BP Percentile      Pulse Rate 74     Resp 18     Temp 97.6 F (36.4 C)     Temp src      SpO2 95 %     Weight      Height      Head Circumference      Peak Flow      Pain Score      Pain Loc      Pain Education      Exclude from Growth Chart    No data found.  Updated Vital Signs BP 105/70   Pulse 74   Temp 97.6 F (36.4 C)   Resp 18   SpO2 95%   Visual Acuity Right  Eye Distance:   Left Eye Distance:   Bilateral Distance:    Right Eye Near:   Left Eye Near:    Bilateral Near:     Physical Exam Constitutional:      General: She is not in acute distress. HENT:     Mouth/Throat:     Mouth: Mucous membranes are moist.  Cardiovascular:     Rate and Rhythm: Normal rate.  Pulmonary:     Effort: Pulmonary effort is normal. No respiratory distress.  Musculoskeletal:        General: Swelling and tenderness present.     Comments: Tenderness and mild edema of left wrist, worse on radial side.  Limited ROM of left wrist but full ROM of left fingers.  2+ radial pulse, sensation intact, brisk capillary refill.  Skin:    General: Skin is warm and dry.     Capillary Refill: Capillary refill takes less than 2 seconds.     Findings: No lesion.  Neurological:     General: No focal deficit present.     Mental Status: She is alert.     Sensory: No sensory deficit.     Motor: No weakness.      UC Treatments / Results  Labs (all labs ordered are listed, but only abnormal results are displayed) Labs Reviewed - No data to display  EKG   Radiology DG Hand Complete Left Result Date: 12/08/2024 CLINICAL DATA:  Fall. EXAM: LEFT HAND - COMPLETE 3+ VIEW COMPARISON:  None Available. FINDINGS: There is a fracture of the distal radius with dorsal angulation of the distal fracture fragment. No dislocation. The bones are osteopenic. Arthritic changes of the base of the thumb. Soft tissue swelling of the wrist. No opaque foreign object or soft tissue gas. IMPRESSION: Dorsally angulated fracture of the distal radius. Electronically Signed   By: Vanetta Chou M.D.   On: 12/08/2024 18:52    Procedures Procedures (including critical care time)  Medications Ordered in UC Medications - No data to display  Initial Impression / Assessment and Plan / UC Course  I have reviewed the triage vital signs and the nursing notes.  Pertinent labs & imaging results that were  available during my care of the patient were reviewed by me  and considered in my medical decision making (see chart for details).   Fracture of left radius.  X-ray shows dorsally angulated fracture of the distal radius.  Telephone consult with on-call orthopedist Dr. Jackquline Barrack.  Per Dr. Barrack, sending patient to the ED; he states she will likely need reduction of the fracture as it is dorsally angulated.  Short arm splint and sling applied here for stabilization to the ED.  Patient is agreeable to going to the ED.  However her husband is vocally irritated and resistant, stating they will sit in the ED all night.  I explained to the patient that she needs to be seen in the ED per the orthopedic surgeon.  She agrees to plan of care.    Final Clinical Impressions(s) / UC Diagnoses   Final diagnoses:  Other closed fracture of distal end of left radius, initial encounter     Discharge Instructions      Per the instructions of the orthopedic surgeon, Dr. Jackquline Barrack, go to the emergency department for the dorsally angulated fracture of your left radius.     ED Prescriptions   None    PDMP not reviewed this encounter.    [1]  Social History Tobacco Use   Smoking status: Former    Current packs/day: 0.00    Types: Cigarettes    Quit date: 2024    Years since quitting: 2.0   Smokeless tobacco: Never  Vaping Use   Vaping status: Never Used  Substance Use Topics   Alcohol use: Yes    Comment: rarely   Drug use: Never     Corlis Burnard DEL, NP 12/08/24 1915  "

## 2024-12-08 NOTE — ED Provider Notes (Signed)
 "  [facility] Provider Note    Event Date/Time   First MD Initiated Contact with Patient 12/08/24 2029     (approximate)   History   Arm Pain   HPI  Deanna [name] is a 74 y.o. female who presents today for evaluation after a slip and fall on the ice.  Patient reports that she went to urgent care and they told her that she needed to come to the emergency department for reduction.  She denies numbness or tingling.  No other injury sustained.  There is no head strike or LOC.  Patient Active Problem List   Diagnosis Date Noted   Hyperparathyroidism, primary 01/24/2024   Mild cognitive impairment of uncertain or unknown etiology 01/14/2022   Decreased estrogen level    Hypertension    Migraine headache    Mixed hyperlipidemia    Osteoporosis    History of colonic polyps    Varicose veins of bilateral lower extremities with other complications           Physical Exam   Triage Vital Signs: ED Triage Vitals  Encounter Vitals Group     BP 12/08/24 2003 (!) 146/70     Girls Systolic BP Percentile --      Girls Diastolic BP Percentile --      Boys Systolic BP Percentile --      Boys Diastolic BP Percentile --      Pulse Rate 12/08/24 2003 78     Resp 12/08/24 2003 18     Temp 12/08/24 2003 98.7 F (37.1 C)     Temp Source 12/08/24 2003 Oral     SpO2 12/08/24 2003 95 %     Weight 12/08/24 2001 93 lb (42.2 kg)     Height 12/08/24 2001 5' 3 (1.6 m)     Head Circumference --      Peak Flow --      Pain Score 12/08/24 2001 4     Pain Loc --      Pain Education --      Exclude from Growth Chart --     Most recent vital signs: Vitals:   12/08/24 2003  BP: (!) 146/70  Pulse: 78  Resp: 18  Temp: 98.7 F (37.1 C)  SpO2: 95%    Physical Exam Vitals and nursing note reviewed.  Constitutional:      General: Awake and alert. No acute distress.    Appearance: Normal appearance. The patient is normal weight.  HENT:     Head: Normocephalic  and atraumatic.     Mouth: Mucous membranes are moist.  Eyes:     General: PERRL. Normal EOMs        Right eye: No discharge.        Left eye: No discharge.     Conjunctiva/sclera: Conjunctivae normal.  Cardiovascular:     Rate and Rhythm: Normal rate and regular rhythm.     Pulses: Normal pulses.  Pulmonary:     Effort: Pulmonary effort is normal. No respiratory distress.     Breath sounds: Normal breath sounds.  Abdominal:     Abdomen is soft. There is no abdominal tenderness. No rebound or guarding. No distention. Musculoskeletal:        General: No swelling. Normal range of motion.     Cervical back: Normal range of motion and neck supple.  Left wrist: Obvious deformity noted to left wrist.  Normal radial pulse.  Able to wiggle all fingers.  No open wounds.  Tenderness palpation over the distal radius.  No tenderness to elbow, humerus, or shoulder.  Compartments soft and compressible throughout arm.  Sensation intact light touch throughout arm Skin:    General: Skin is warm and dry.     Capillary Refill: Capillary refill takes less than 2 seconds.     Findings: No rash.  Neurological:     Mental Status: The patient is awake and alert.      ED Results / Procedures / Treatments   Labs (all labs ordered are listed, but only abnormal results are displayed) Labs Reviewed - No data to display   EKG     RADIOLOGY I independently reviewed and interpreted imaging and agree with radiologists findings.     PROCEDURES:  Critical Care performed:   .Ortho Injury Treatment  Date/Time: 12/08/2024 10:40 PM  Performed by: Lurlie Wigen E, PA-C Authorized by: Haru Shaff E, PA-C   Consent:    Consent obtained:  Verbal   Consent given by:  Patient   Risks discussed:  Fracture, irreducible dislocation, nerve damage, recurrent dislocation, restricted joint movement, stiffness and vascular damage   Alternatives discussed:  No treatmentInjury location: wrist Location details:  left wrist Injury type: fracture Fracture type: distal radius Pre-procedure neurovascular assessment: neurovascularly intact Pre-procedure distal perfusion: normal Pre-procedure neurological function: normal Pre-procedure range of motion: reduced Anesthesia: hematoma block  Anesthesia: Local anesthesia used: yes Local Anesthetic: lidocaine  1% without epinephrine  and bupivacaine  0.5% without epinephrine  Anesthetic total: 10 mL  Patient sedated: NoManipulation performed: yes Reduction successful: yes X-ray confirmed reduction: yes Immobilization: splint and sling Splint type: sugar tong Splint Applied by: ED Provider Supplies used: cotton padding and Ortho-Glass Post-procedure neurovascular assessment: post-procedure neurovascularly intact Post-procedure distal perfusion: normal Post-procedure neurological function: normal Post-procedure range of motion: improved Post-procedure range of motion comment: in splint      MEDICATIONS ORDERED IN ED: Medications  lidocaine  (PF) (XYLOCAINE ) 1 % injection 5 mL (5 mLs Infiltration Given by Other 12/08/24 2058)  bupivacaine (PF) (MARCAINE ) 0.5 % injection 10 mL (10 mLs Infiltration Given by Other 12/08/24 2058)  oxyCODONE  (Oxy IR/ROXICODONE ) immediate release tablet 5 mg (5 mg Oral Given 12/08/24 2220)     IMPRESSION / MDM / ASSESSMENT AND PLAN / ED COURSE  I reviewed the triage vital signs and the nursing notes.   Differential diagnosis includes, but is not limited to, distal radius fracture, dislocation, contusion, ligamental injury.  I reviewed the patient's chart.  Patient was seen at urgent care earlier today, they spoke with orthopedic surgery who recommended that they come here for reduction.  Patient is awake and alert, hemodynamically stable and afebrile.  She was in a splint upon arrival which was removed during my evaluation.  There are no open wounds.  She has normal radial pulse, sensation is intact to light touch  throughout.  No signs or symptoms of compartment syndrome.  I was able to review the image from urgent care.  Patient agreed to plan for reduction.  Hematoma block performed with good effect, patient was calm in the finger traps and her distal radius fracture was reduced.  Postreduction film reveals interval reduction of her distal radius.  Splinted in this position and instructed to follow-up with orthopedics hand surgery.  The appropriate follow-up information was provided.  We also discussed splint care.  Patient understands and agrees with plan.  She was discharged in stable condition.   Patient's presentation is most consistent with acute complicated illness / injury requiring diagnostic  workup.      FINAL CLINICAL IMPRESSION(S) / ED DIAGNOSES   Final diagnoses:  Closed fracture of left wrist, initial encounter     Rx / DC Orders   ED Discharge Orders     None        Note:  This document was prepared using Dragon voice recognition software and may include unintentional dictation errors.   Brecklyn Galvis E, PA-C 12/08/24 2241    Jacolyn Pae, MD 12/08/24 [phone]  "

## 2024-12-08 NOTE — Discharge Instructions (Signed)
 Per the instructions of the orthopedic surgeon, Dr. Jackquline Barrack, go to the emergency department for the dorsally angulated fracture of your left radius.

## 2024-12-08 NOTE — ED Triage Notes (Signed)
 Pt arrives from UC where she was sent in after being dx with fx to L wrist and arm. Splint in place from UC with sling. Per pt report, was told to come here to evaluate need for surgery. Pt ambulatory to triage. Alert and oriented. Breathing unlabored speaking in full sentences. Symmetric chest rise and fall.

## 2024-12-08 NOTE — ED Notes (Signed)
 Pt and family given discharge paperwork and instructions. Pt and family verbalized understanding. Pt alert and oriented; pt ambulated out of ED with family with a steady gait

## 2024-12-08 NOTE — ED Notes (Signed)
 Patient is being discharged from the Urgent Care and sent to the Emergency Department via POV with spouse. Per Corlis, NP, patient is in need of higher level of care due to fracture. Patient is aware and verbalizes understanding of plan of care.  Vitals:   12/08/24 1759  BP: 105/70  Pulse: 74  Resp: 18  Temp: 97.6 F (36.4 C)  SpO2: 95%

## 2024-12-08 NOTE — Discharge Instructions (Addendum)
 Please follow-up with hand surgery for your wrist fracture.  Keep your splint clean and dry.  Please return for any new, worsening, or changing symptoms or other concerns.  It was a pleasure caring for you today.

## 2025-06-16 ENCOUNTER — Ambulatory Visit: Admitting: Neurology

## 2025-06-16 ENCOUNTER — Ambulatory Visit
